# Patient Record
Sex: Female | Born: 1940 | Race: White | Hispanic: No | Smoking: Never smoker
Health system: Southern US, Community
[De-identification: ages and names within clinical notes are randomized; demographics above are authoritative.]

## PROBLEM LIST (undated history)

## (undated) DIAGNOSIS — N393 Stress incontinence (female) (male): Secondary | ICD-10-CM

## (undated) DIAGNOSIS — K219 Gastro-esophageal reflux disease without esophagitis: Secondary | ICD-10-CM

## (undated) DIAGNOSIS — K922 Gastrointestinal hemorrhage, unspecified: Secondary | ICD-10-CM

## (undated) DIAGNOSIS — N1330 Unspecified hydronephrosis: Secondary | ICD-10-CM

## (undated) DIAGNOSIS — Z862 Personal history of diseases of the blood and blood-forming organs and certain disorders involving the immune mechanism: Secondary | ICD-10-CM

## (undated) DIAGNOSIS — N6019 Diffuse cystic mastopathy of unspecified breast: Secondary | ICD-10-CM

## (undated) DIAGNOSIS — E785 Hyperlipidemia, unspecified: Secondary | ICD-10-CM

## (undated) DIAGNOSIS — J302 Other seasonal allergic rhinitis: Secondary | ICD-10-CM

## (undated) DIAGNOSIS — F419 Anxiety disorder, unspecified: Secondary | ICD-10-CM

## (undated) DIAGNOSIS — Z87828 Personal history of other (healed) physical injury and trauma: Secondary | ICD-10-CM

## (undated) DIAGNOSIS — R413 Other amnesia: Secondary | ICD-10-CM

## (undated) DIAGNOSIS — I10 Essential (primary) hypertension: Secondary | ICD-10-CM

## (undated) DIAGNOSIS — M1990 Unspecified osteoarthritis, unspecified site: Secondary | ICD-10-CM

## (undated) DIAGNOSIS — G43809 Other migraine, not intractable, without status migrainosus: Secondary | ICD-10-CM

## (undated) DIAGNOSIS — F329 Major depressive disorder, single episode, unspecified: Secondary | ICD-10-CM

## (undated) DIAGNOSIS — F32A Depression, unspecified: Secondary | ICD-10-CM

## (undated) HISTORY — DX: Hyperlipidemia, unspecified: E78.5

## (undated) HISTORY — PX: ABDOMINAL HYSTERECTOMY: SHX81

## (undated) HISTORY — DX: Essential (primary) hypertension: I10

## (undated) HISTORY — PX: BREAST SURGERY: SHX581

---
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
## 2001-05-05 ENCOUNTER — Other Ambulatory Visit: Admission: RE | Admit: 2001-05-05 | Discharge: 2001-05-05 | Payer: Self-pay | Admitting: Plastic Surgery

## 2004-10-30 ENCOUNTER — Encounter: Admission: RE | Admit: 2004-10-30 | Discharge: 2004-10-30 | Payer: Self-pay | Admitting: Internal Medicine

## 2004-12-08 ENCOUNTER — Ambulatory Visit: Admission: RE | Admit: 2004-12-08 | Discharge: 2004-12-08 | Payer: Self-pay | Admitting: Family Medicine

## 2005-01-12 ENCOUNTER — Encounter (INDEPENDENT_AMBULATORY_CARE_PROVIDER_SITE_OTHER): Payer: Self-pay | Admitting: Specialist

## 2005-01-12 ENCOUNTER — Inpatient Hospital Stay (HOSPITAL_COMMUNITY): Admission: RE | Admit: 2005-01-12 | Discharge: 2005-01-20 | Payer: Self-pay | Admitting: General Surgery

## 2005-01-30 ENCOUNTER — Encounter: Admission: RE | Admit: 2005-01-30 | Discharge: 2005-01-30 | Payer: Self-pay | Admitting: Thoracic Surgery

## 2005-02-14 ENCOUNTER — Encounter: Admission: RE | Admit: 2005-02-14 | Discharge: 2005-02-14 | Payer: Self-pay | Admitting: Thoracic Surgery

## 2005-03-07 ENCOUNTER — Encounter: Admission: RE | Admit: 2005-03-07 | Discharge: 2005-03-07 | Payer: Self-pay | Admitting: Thoracic Surgery

## 2009-01-05 ENCOUNTER — Ambulatory Visit: Payer: Self-pay | Admitting: Vascular Surgery

## 2009-05-24 DIAGNOSIS — Z8719 Personal history of other diseases of the digestive system: Secondary | ICD-10-CM

## 2009-05-24 HISTORY — DX: Personal history of other diseases of the digestive system: Z87.19

## 2009-06-20 ENCOUNTER — Inpatient Hospital Stay (HOSPITAL_COMMUNITY): Admission: EM | Admit: 2009-06-20 | Discharge: 2009-06-23 | Payer: Self-pay | Admitting: Emergency Medicine

## 2009-06-20 ENCOUNTER — Ambulatory Visit: Payer: Self-pay | Admitting: Vascular Surgery

## 2009-06-22 ENCOUNTER — Encounter (HOSPITAL_BASED_OUTPATIENT_CLINIC_OR_DEPARTMENT_OTHER): Payer: Self-pay | Admitting: Internal Medicine

## 2011-04-01 LAB — GLUCOSE, CAPILLARY: Glucose-Capillary: 90 mg/dL (ref 70–99)

## 2011-04-02 LAB — URINALYSIS, ROUTINE W REFLEX MICROSCOPIC
Bilirubin Urine: NEGATIVE
Glucose, UA: NEGATIVE mg/dL
Hgb urine dipstick: NEGATIVE
Nitrite: NEGATIVE
Specific Gravity, Urine: 1.015 (ref 1.005–1.030)
pH: 6 (ref 5.0–8.0)

## 2011-04-02 LAB — CBC
HCT: 33 % — ABNORMAL LOW (ref 36.0–46.0)
HCT: 35 % — ABNORMAL LOW (ref 36.0–46.0)
Hemoglobin: 11.2 g/dL — ABNORMAL LOW (ref 12.0–15.0)
MCHC: 33.5 g/dL (ref 30.0–36.0)
MCHC: 33.9 g/dL (ref 30.0–36.0)
MCV: 92.3 fL (ref 78.0–100.0)
MCV: 92.6 fL (ref 78.0–100.0)
MCV: 93.3 fL (ref 78.0–100.0)
Platelets: 152 10*3/uL (ref 150–400)
Platelets: 160 10*3/uL (ref 150–400)
Platelets: 161 10*3/uL (ref 150–400)
RBC: 3.58 MIL/uL — ABNORMAL LOW (ref 3.87–5.11)
RDW: 14.5 % (ref 11.5–15.5)
RDW: 14.8 % (ref 11.5–15.5)
RDW: 15.1 % (ref 11.5–15.5)
WBC: 11.9 10*3/uL — ABNORMAL HIGH (ref 4.0–10.5)

## 2011-04-02 LAB — DIFFERENTIAL
Basophils Relative: 0 % (ref 0–1)
Eosinophils Absolute: 0.1 10*3/uL (ref 0.0–0.7)
Eosinophils Relative: 1 % (ref 0–5)
Monocytes Absolute: 0.7 10*3/uL (ref 0.1–1.0)
Monocytes Relative: 6 % (ref 3–12)

## 2011-04-02 LAB — ABO/RH: ABO/RH(D): O POS

## 2011-04-02 LAB — GLUCOSE, CAPILLARY: Glucose-Capillary: 62 mg/dL — ABNORMAL LOW (ref 70–99)

## 2011-04-02 LAB — COMPREHENSIVE METABOLIC PANEL
ALT: 14 U/L (ref 0–35)
Albumin: 2.7 g/dL — ABNORMAL LOW (ref 3.5–5.2)
Alkaline Phosphatase: 45 U/L (ref 39–117)
Chloride: 111 mEq/L (ref 96–112)
Potassium: 3.4 mEq/L — ABNORMAL LOW (ref 3.5–5.1)
Sodium: 140 mEq/L (ref 135–145)
Total Protein: 4.8 g/dL — ABNORMAL LOW (ref 6.0–8.3)

## 2011-04-02 LAB — BASIC METABOLIC PANEL
BUN: 8 mg/dL (ref 6–23)
Chloride: 112 mEq/L (ref 96–112)
GFR calc non Af Amer: >60 mL/min (ref >60)
Glucose, Bld: 110 mg/dL — ABNORMAL HIGH (ref 70–99)
Potassium: 3.7 mEq/L (ref 3.5–5.1)

## 2011-04-02 LAB — CROSSMATCH: Antibody Screen: NEGATIVE

## 2011-04-02 LAB — POCT CARDIAC MARKERS: CKMB, poc: <1 ng/mL — ABNORMAL LOW (ref 1.0–8.0)

## 2011-05-08 NOTE — Consult Note (Signed)
NEW PATIENT CONSULTATION   Alison, Mcguire D  DOB:  1941-09-27                                       01/05/2009  OZHYQ#:[id number]   The patient presents today for evaluation of right leg pain and also  irregularity in the medial proximal thighs bilaterally.  She feels that  this is nodular and feels this to be venous.  She does have discomfort  in her right leg, she describes this as an aching sensation throughout  and describes it as similar to shingles type discomfort.  She does have  a history of noninsulin-dependent diabetes, has a history of elevated  cholesterol.  She denies prior cardiac difficulties.  She does have a  premature history of atherosclerotic disease in brothers.   SOCIAL HISTORY:  She is married, 3 children.  She is retired.  She does  not smoke, drink alcohol.   REVIEW OF SYSTEMS:  Positive for weight gain, shortness of breath with  exertion, pain in her legs with walking and lying flat, arthritic joint  pain.  She does have a history of prior, what sounds like to, erosive  esophagitis or Barrett's and has undergone a prior fundoplication.   ALLERGIES TO MEDICATIONS:  Codeine, Ambien, Tylox, Demerol.   MED LIST:  Is attached to her chart.   PHYSICAL EXAM:  General:  A well-developed well-nourished white female  appearing stated age of 8.  Vital Signs:  Blood pressure is 134/81,  pulse 78, respirations 18.  Her radial, femoral and posterior tibial  pulses are 2+ bilaterally.  She does not have any evidence of large  varicosities.  She has an area on the medial thigh near the perineum  bilaterally with superficial skin discoloration and perhaps some  thickening.  This does not appear to be venous and does not appear to  have any evidence of thrombophlebitis.  She does not have any  significant swelling.   She underwent noninvasive vascular laboratory studies in our office  which show scattered superficial veins but no evidence of  large  varicosities.  I imaged the area over these areas of concern and do not  see any evidence of any venous structures under this.  She had undergone  noninvasive vascular laboratory studies in Dr. Silvano Rusk office showing  normal arterial flow and this is confirmed with her physical exam.  I  had a long discussion with the patient.  I explained that she does not  appear to have any significant pathology in her arterial or venous  system.  I am unclear as to the etiology of her discomfort or the area  of concern in the upper thighs.  I have reassured her that I do not see  any serious pathology.  She will continue to follow-up with Dr.  Eloise Harman.  I suggested that perhaps dermatologic evaluation of these  areas would be appropriate if they persist.   Larina Earthly, M.D.  Electronically Signed   TFE/MEDQ  D:  01/05/2009  T:  01/06/2009  Job:  [id number]   cc:   Barry Dienes. Eloise Harman, M.D.

## 2011-05-08 NOTE — Consult Note (Signed)
NAME:  Alison Mcguire, Alison Mcguire NO.:  [id number]   MEDICAL RECORD NO.:  [id number]          PATIENT TYPE:  INP   LOCATION:  1341                         FACILITY:  [facility]   PHYSICIAN:  Angelia Mould. Derrell Lolling, M.D.DATE OF BIRTH:  [birth year]   DATE OF CONSULTATION:  06/21/2009  DATE OF DISCHARGE:                                 CONSULTATION   REASON FOR CONSULTATION:  Evaluate lower GI bleed due to cecal ulcer.   HISTORY OF PRESENT ILLNESS:  This is a 70 year old Caucasian female who  has been in stable health.  Yesterday at approximately 11 a.m. after  eating while driving a car she developed sharp, cramping abdominal pain,  stopped, went to the bathroom and had a bloody bowel movement with  clots.  She became dizzy with tingling in her arms and called 9-1-1, and  was transported to the emergency room for evaluation.  She was given IV  fluids and actually started on blood transfusion at that time.  She  denied any nausea, vomiting, constipation, diarrhea, fever or chills or  prior episodes.  She was seen by emergency department physician and  admitted by Dr. Jarome Matin.   She has stabilized and the bleeding has stopped.  Dr. Kinnie Scales came to see  her and he gave her a bowel prep and performed a colonoscopy about 1  hour ago, and the findings were of an acute cecal ulcer.  He describes  this as a large ulcer but perhaps 1-2 cm at most.  It was not actively  bleeding.  There was a visible vessel.  He was able to clip this and  there was no bleeding after the clipping.  The patient has emerged from  her sedation and feels fine at this time.   Dr. Kinnie Scales asked me to evaluate her and to stand by in case she re-  bleeds.  We have discussed this and he feels that he has done just about  all he can from a colonoscopic standpoint and if she re-bleeds, she  probably should be considered for emergent limited right colectomy.   PAST HISTORY:  Gastroesophageal reflux disease and  achalasia and large  esophageal diverticulum.  This was resected in 2006 with myotomy and  Belsey Mark IV fundoplication.  She has had atypical chest pain 2005  with a normal cardiac workup and normal Cardiolite exercise test.  Last  colonoscopy was 5 years ago showing only diverticulosis.  She has  diabetes mellitus type 2.  She has hyperlipidemia and depression.  She  has had a tonsillectomy 1954.  Vaginal hysterectomy 1970.  Bilateral  simple mastectomies 1974 because of fibrocystic change.  Right breast  implant 1982.  Redo right breast implant 2002.   MEDICATIONS:  1. Actos 30 mg a day.  2. Aspirin 81 mg a day.  3. Premarin 0.3 mg a day.  4. Ativan 0.5 mg three times a day.  5. Effexor XR 150 mg daily.  6. Vitamin D.  7. Detrol LA 4 mg daily.  8. Prilosec 20 mg or Zantac 150 mg once daily.  9. Lovastatin 20 mg a day.  10.Maxzide  25 mg tablet daily.   DRUG ALLERGIES:  CODEINE, BETADINE, CLINORIL, SOMA, DEMEROL, ETHER.  Intolerance of ANTIHISTAMINES.   SOCIAL HISTORY:  She is married.  She is a retired Engineer, civil (consulting).  She denies  alcohol or tobacco.  She has three children.  One son died at a young  age from myocardial infarction.  Another son has diabetes.  She has a  daughter in good health.   FAMILY HISTORY:  Father died age 5 MVA.  Mother died age 62 of cervical  cancer.  No family history of colon cancer or breast cancer.   REVIEW OF SYSTEMS:  A 10-system review of systems is performed and is  noncontributory except as described above.   PHYSICAL EXAM:  CONSTITUTIONAL:  This is a very pleasant, alert  Caucasian female in no distress.  Very conversant.  Very oriented.  VITAL SIGNS:  Have been stable, heart rate has been running around 88,  blood pressure around 113/65, oxygen saturations in the around 98 on  room air.  EYES:  Sclerae clear.  Extraocular intact.  EARS, MOUTH, THROAT, NOSE, TONGUE AND OROPHARYNX:  Without gross  lesions.  NECK:  Supple, nontender.  No mass.   No jugular distention.  No bruit.  LUNGS:  Clear to auscultation.  No chest wall tenderness.  HEART:  Regular regular rate and rhythm with occasional ectopy.  No  murmur.  ABDOMEN:  Soft, flat, nontender.  I do not feel any mass do not feel any  scars.  Liver and spleen are not enlarged.  EXTREMITIES:  She moves all four extremities well without pain or  deformity.  She may have trace ankle edema.  NEUROLOGIC:  No gross motor sensory deficits.  RECTAL:  Not performed since she recently completed a colonoscopy.   LAB WORK:  Hemoglobin at 3:30 a.m. was 11.9.  Hemoglobin at 2:15 p.m.  today was 11.2, glucose 110.  Electrolytes normal.  Liver function tests  normal.  PT 14.3, INR 1.1, PTT 26 all normal.  Blood type is O positive.   ASSESSMENT:  1. Lower gastrointestinal bleeding due to presumably benign cecal      ulcer.  Pathophysiology of this is unclear since she does not take      any NSAIDS and is not an alcohol user.  She does take aspirin once      a day.  2. History of gastroesophageal reflux disease in achalasia status post      myotomy and Belsey Mark IV fundoplication.  3. Diabetes mellitus type 2.  4. History of shingles, recently treated.  5. Hyperlipidemia.  6. Depression.  7. Status post vaginal hysterectomy.   PLAN:  1. The patient will be observed on clear liquid diet overnight, and      advance to full liquid diet tomorrow.  2. Hopefully the bleeding has stopped and she will be able to be      discharged in 24-48 hours with followup colonoscopy in 3 months      planned by Dr. Kinnie Scales.  3. If she re-bleeds we will be forced to offer emergent limited right      colectomy.  This could be considered to be done laparoscopically if      her vital signs are stable.  If not, then we would simply to do it      open for expeditious regions.   I explained this to the patient and her husband.  They showed full  understanding.  She is a retired Engineer, civil (consulting)  and has good medical  insight.      Angelia Mould. Derrell Lolling, M.D.  Electronically Signed     HMI/MEDQ  D:  06/21/2009  T:  06/21/2009  Job:  [id number]   cc:   Griffith Citron, M.D.  Fax: [phone]   Barry Dienes. Eloise Harman, M.D.  Fax: [phone]

## 2011-05-08 NOTE — Op Note (Signed)
NAME:  DANY, HARTEN NO.:  [id number]   MEDICAL RECORD NO.:  [id number]          PATIENT TYPE:  INP   LOCATION:  1341                         FACILITY:  [facility]   PHYSICIAN:  Griffith Citron, M.D.DATE OF BIRTH:  20-Apr-1941   DATE OF PROCEDURE:  06/21/2009  DATE OF DISCHARGE:                               OPERATIVE REPORT   Colonoscopy with endoclip.   INDICATIONS:  A 70 year old white female with a lower GI hemorrhage.   PREPARATION:  One-half gallon of HalfLytely.   SEDATION:  Versed 10 mg, fentanyl 100 mcg IV.   PROCEDURE:  After reviewing the nature of the procedure with the  patient, including but not limited to the potential risks of hemorrhage  and perforation, informed consent was signed.  The patient was brought  to the endoscopy suite, where she was premedicated with divided doses of  Versed and fentanyl.  Using a Pentax video colonoscope, the rectum was  intubated after digital examination revealed no evidence of perianal or  intrarectal pathology.  The colonoscope was inserted and advanced  through the entire length of the colon to the cecum, which was  identified by the appendiceal orifice and ileocecal valve.  These were  photographed.  The preparation was excellent throughout.  There were  flecks of bright red blood in the otherwise clear effluent.   At the base of the ileocecal valve in the cecum, a 1 cm shaggy bordered  ulcer was identified with visible vessel.  There was no bleeding.  There  was no evidence of neoplastic infiltration or ischemia.  The cecum was  rather spastic and difficult to fully insufflate.  I was able to place 2  hemoclips across the base of the ulcer, and hemostasis was maintained  throughout.   The scope was then slowly withdrawn with careful inspection of the  entire colon in a retrograde manner.  There were scattered diverticula  in both the right and left colon with no evidence of clot in the  diverticula and no  evidence of inflammation.  No neoplasia was  identified.  The mucosa was intact, and there was no evidence of  ischemic change, neoplasia, AVM, or other abnormality to explain  bleeding.  Retroflexed view in the rectal vault was normal.  Hemorrhoids  were not appreciated.   The colon was decompressed, the scope withdrawn.  Patient tolerated the  procedure without difficulty, being maintained on low-flow oxygen, pulse  oximetry throughout.  Returned to recovery in stable condition.   PROCEDURE TIME:  Forty minutes.   Time:  3.  Technical:  3  Preparation:  1.  Total equal to 7.   ASSESSMENT:  1. Cecal ulcer with visible vessel, probable source of lower      gastrointestinal hemorrhage, etiology uncertain.  2. Hemoclips placed across the cecal ulcer with hemostasis maintained      throughout.  3. Diverticulosis, rare, right and left colon, nonbleeding.   RECOMMENDATIONS:  1. Maintain on clear liquids overnight.  Advance diet in the morning      if stable.  2. I discussed today's procedure with Dr. Timothy Lasso covering for Dr.  Jarome Matin.  I also discussed the case with Dr. Claud Kelp, who will see the patient in consultation for surgery.  I do      not think surgery will be needed unless bleeding occurs, in which      case a right hemicolectomy is recommended.  3. Repeat colonoscopy in 2 to 3 months to reassess the ulcer bed and      evaluate possible underlying pathology.      Griffith Citron, M.D.  Electronically Signed     JRM/MEDQ  D:  06/21/2009  T:  06/21/2009  Job:  [id number]   cc:   Angelia Mould. Derrell Lolling, M.D.  [address]  [place]  [place] [address]   Barry Dienes. Eloise Harman, M.D.  Fax: [phone]

## 2011-05-08 NOTE — Consult Note (Signed)
NAME:  Alison Mcguire, Alison Mcguire NO.:  [id number]   MEDICAL RECORD NO.:  [id number]          PATIENT TYPE:  EMS   LOCATION:  ED                           FACILITY:  [facility]   PHYSICIAN:  Griffith Citron, M.D.DATE OF BIRTH:  [birth year]   DATE OF CONSULTATION:  06/20/2009  DATE OF DISCHARGE:  06/20/2009                                 CONSULTATION   REFERRING PHYSICIAN:  Dr. Jarome Matin   REASON FOR CONSULTATION:  A 70 year old white female patient of Dr.  Jarome Matin who asked me to assist with evaluation and treatment of  patient who presents with acute GI bleeding.   HISTORY OF PRESENT ILLNESS:  The patient was in her usual state of good  health this morning.  While driving at 16:10 a.m., she noted the acute  onset of sharp stabbing abdominal pain.  This generalized and became  cramping in nature.  She noted associated abdominal distention.  This  was rapidly followed by the need to have an urgent bowel movement at  approximately 11:00 a.m.  The patient then began to pass large volume of  maroon-colored blood and large clots per rectum.  This went on for  approximately 20 minutes until she became presyncopal.  The patient then  called EMS by cell phone who arrived within a matter of minutes.  The  patient was brought to [facility] Emergency Room where she was noted to  have an initial blood pressure of 90/40 at approximately 1:30 p.m.; her  pulse was 100.  She did not become completely syncopal.  No head trauma.  Since admission to [facility] Emergency Room, the patient has had no  further bleeding.  Vital signs have been stable.  She does complain of  mild generalized abdominal soreness, somewhat greater in the lower  quadrants, right equal to left.   There has been no nausea or vomiting.  The patient denies dyspepsia, no  pyrosis or regurgitation.  She denies hematemesis.   The patient has minimal risk factors.  She denies NSAIDs.  She does use  cardioprotective doses of aspirin.  She is receiving her first unit of  PRBC.   The patient has a past GI history of achalasia with large esophageal  diverticulum.  She underwent resection by Dr. Dewayne Shorter in 2006 of the  esophageal diverticulum, myotomy, and fundoplication which were  successfully completed.  Despite this, she continues to have symptoms of  dysphagia.  I initially evaluated her in 2006 and performed endoscopy  with Savary esophageal dilation.  This was of minimal benefit.  She  denies impaction and again without vomiting, retching or hematemesis.  She is maintained on a generic PPI.   The patient underwent colonoscopy in 2005, at which time she had rare  diverticulum; no neoplasia was identified.  Otherwise, she has no  significant lower GI history.   PAST MEDICAL HISTORY:  1. Diabetes.  2. Hyperlipidemia.  3. Hypertension.  4. Depression.   SOCIAL HISTORY:  Patient is a Press photographer.  She does extensive  volunteer work, nondrinker, nonsmoker, no drug use.   CURRENT MEDICAL REGIMEN:  1.  Lorazepam 1 mg t.i.d.  2. Lovastatin 20 mg daily.  3. Estrogen/methyltestosterone 1 daily.  4. Vitamin D 50,000 units weekly.  5. Effexor XR 150 mg every evening.  6. Actos 30 mg daily.  7. ? - PPI.  (The patient claims that she is taking a generic PPI, but      this is not on her current drug list.   PHYSICAL EXAMINATION:  GENERAL:  The patient is lying in bed, alert,  acute, oriented, in no acute distress receiving first unit of PRBC.  VITAL SIGNS:  BP 112/70, pulse 100 increasing to 114 with sitting.  HEENT:  Atraumatic.  EOMI.  PERRL.  Mild conjunctival pallor.  Mouth:  No oropharyngeal lesion.  Dentition in poor repair.  NECK:  Supple, no adenopathy or thyromegaly.  CHEST:  Clear to auscultation.  CARDIAC:  Regular rhythm, no gallop, no murmur.  ABDOMEN:  Quiet, nondistended, mild diffuse tenderness greatest in the  lower quadrants, right equal to left.  No  rebound, no guarding.  No  organomegaly.  Obese, striae.  RECTAL:  Not repeated with maroon stool documented in the ER.  EXTREMITIES:  No clubbing, cyanosis or edema.   LABORATORY:  WBC 11.9, hemoglobin 11.2, hematocrit 33, platelet 161,000.  CMP notable for BUN of 15, creatinine 1.05, total protein 4.8, albumin  27, pro time and PTT normal.   ASSESSMENT:  1. Lower gastrointestinal hemorrhage:  The patient's history, finding      of maroon stool with passage of clots, lower abdominal pain, acute      onset, absent dyspepsia, lack of risk factors and normal BUN to      creatinine ratio all strongly point to a larger gastrointestinal      source of bleeding.  Most likely, this is a diverticular bleed or      perhaps ischemic bowel.  The tenderness certainly suggests ischemic      bowel, and the patient has been working out in the yard      considerably and is also on hormone replacement therapy, all of      which would favor an ischemic picture.  Patient has not had any      further bleeding since admission 7 hours ago.  She is being      rehydrated and receiving first unit of packed red blood cells.  2. Anemia secondary to acute gastrointestinal blood loss.  3. History of esophageal surgery--diverticulum resection, myotomy for      achalasia, fundoplication.   RECOMMENDATIONS:  1. Supportive:  IV fluids transfused p.r.n.  2. Prophylactic PPI coverage.  3. Clear liquids.  4. Abdominal flat plate.  5. Bed and bowel rest.  6. Anticipate colonoscopy when stable, most likely June 22, 2009.      Griffith Citron, M.D.  Electronically Signed     JRM/MEDQ  D:  06/20/2009  T:  06/20/2009  Job:  [id number]   cc:   Barry Dienes. Eloise Harman, M.D.  Fax: [phone]

## 2011-05-08 NOTE — H&P (Signed)
NAME:  Alison Mcguire, Alison Mcguire NO.:  [id number]   MEDICAL RECORD NO.:  [id number]          PATIENT TYPE:  EMS   LOCATION:  ED                           FACILITY:  [facility]   PHYSICIAN:  Barry Dienes. Eloise Harman, M.D.DATE OF BIRTH:  [birth year]   DATE OF ADMISSION:  06/20/2009  DATE OF DISCHARGE:  06/20/2009                              HISTORY & PHYSICAL   CHIEF COMPLAINT:  Rectal bleeding.   HISTORY OF PRESENT ILLNESS:  The patient is a 70 year old white woman  who is well known to me.  She was in her usual state of good health this  morning.  After having a banana sandwich this a.m., at approximately  11:00 a.m. while driving, she developed sharp cramping abdominal pain,  so she stopped at a gas station to go to the bathroom.  In the bathroom,  she had a large amount of bright red blood pass per rectum with clots  that lasted several minutes.  She also became very dizzy with tingling  in her arms, so she called 911.  Emergency medical personnel noted a  large amount of bright red blood in the toilet bowl and on her clothing  and transported her to the emergency room for evaluation.  In the  ambulance, she was given 1 L of IV fluids.  In the emergency room, she  has also been given IV fluids and started on transfusion of 2 units of  packed red blood cells.  She denies having symptoms of nausea, vomiting,  constipation or diarrhea, or fever or chills.  Currently, she feels at  her baseline and is not having cramping abdominal pain.   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  She  has also had atypical chest pain with a 2005 Cardiolite exercise test  normal.  Her last colonoscopy was in 2005 showing only diverticulosis  with no neoplasms.  She also has a history of diabetes mellitus, type 2,  with most recent hemoglobin  A1c level in May 2010 at 5.9%.  She has  hyperlipidemia, depression, and mild forgetfulness.   She was recently treated for shingles with Valtrex 1000 mg b.i.d. for 5  days that ended on June 11, 2009, and with prednisone burst treatment  that ended on June 13, 2009.   Other medications prior to admission:  1. Actos 30 mg daily.  2. Aspirin 81 mg daily.  3. Premarin 0.3 mg p.o. daily.  4. Ativan 0.5 mg p.o. t.i.d.  5. Effexor XR 150 mg p.o. daily.  6. Vitamin D 50,000 units p.o. once weekly.  7. Detrol LA 4 mg p.o. daily.  8. Prilosec 20 mg or Zantac 150 mg once daily.  9. Lovastatin 20 mg daily.  10.Maxzide 25 one tab p.o. daily.   PAST SURGICAL HISTORY:  In 1954, tonsillectomy.  In 1970, vaginal  hysterectomy.  In 1974, bilateral simple mastectomies because of  fibrocystic change.  In 1982, right breast implant.  In 2002, redo of  right breast  implant.  In January 2006, esophageal diverticulum  resection with esophageal myotomy and Belsey Mark IV fundoplication.  In  March 2006, EGD with Savary dilatation.   ALLERGIES:  CODEINE, BETADINE, CLINORIL, SOMA, DEMEROL, AND ETHER.  She  has intolerance of some ANTIHISTAMINES.   SOCIAL HISTORY:  She is married.  She is a retired [employer] fund[employer].  She has no  history of tobacco or alcohol abuse.  She had 3 children.  One son died  at a young age from a myocardial infarction.  Another son has diabetes  mellitus, type 2, and a daughter is in good health and has 2 children.   FAMILY HISTORY:  Father died at age 5 of motor vehicle accident.  The  mother died at age 41 of cervical cancer.  There is no family history of  colon cancer or breast cancer.   REVIEW OF SYSTEMS:  She denies recent fever, chills, shortness of  breath, cough, chest pain or palpitations, nausea, vomiting,  constipation or dysuria.   INITIAL PHYSICAL EXAMINATION:  VITAL SIGNS:  Initial blood pressure was  92/46 and currently is 113/60 with pulse 94, respirations 20 and   temperature 98.3 with pulse oxygen saturation 99% on nasal cannula  oxygen.  IN GENERAL:  She is a well-nourished, well-developed, white female who  is in no apparent distress while lying semi-supine.  HEENT:  Within normal limits.  NECK:  Supple without jugular venous distention or carotid bruit.  CHEST:  Clear to auscultation.  HEART:  Irregular rate and rhythm without significant murmur or gallop.  ABDOMEN:  Normal bowel sounds and no hepatosplenomegaly or tenderness.  EXTREMITIES:  Bilateral trace leg edema.  NEUROLOGICAL:  She was alert and well ordered with a normal affect with  no focal neurologic deficits.  RECTAL:  Showed maroon-colored stool with no melena.   INITIAL LABORATORY STUDIES:  White blood cell count 11.9, hemoglobin  11.2, hematocrit 33, platelets 161.  Serum sodium 140, potassium 3.4,  chloride 111, carbon dioxide 27, BUN 15, creatinine 0.8, glucose 162.  Urinalysis was nitrite negative.   IMPRESSION/PLAN:  1. Rectal bleeding:  This most likely is secondary to a diverticular      bleed given her lack of melena or elevation in BUN level as well as      initial hypotension.  Ischemic colitis, neoplasm or an upper      gastrointestinal source of blood loss seems less likely.  We will      treat her with empiric Protonix IV and complete the transfusion of      2 units of packed red blood cells.  A colonoscopy with possible      endoscopy is currently planned for June 22, 2009, or sooner if      symptoms increase.  We may do a nuclear medicine tagged red blood      cell study if bleeding persists.  Aspirin, of course, will be held.  2. Diabetes mellitus, type 2:  Stable on Actos.  Per her request, we      will monitor her blood glucose levels once daily and give sliding      scale insulin only if the levels are significantly elevated.  3. Hypokalemia:  Mild and most likely due to the administration of      moderate volume normal saline.  Her IV fluids will be changed  to      normal saline with 20 mEq of potassium per L, and we will recheck  her BMET blood test in the a.m.           ______________________________  Barry Dienes. Eloise Harman, M.D.     DGP/MEDQ  D:  06/20/2009  T:  06/20/2009  Job:  [id number]   cc:   Ines Bloomer, M.D.  [address]  [place]  [place] [address]   Lorne Skeens. Hoxworth, M.D.  [address]  [place]  [place] [address]   Cassell Clement, M.D.  Fax: [phone]

## 2011-05-08 NOTE — Procedures (Signed)
LOWER EXTREMITY VENOUS REFLUX EXAM   INDICATION:  Bilateral varicose vein with pain.   EXAM:  Using color-flow imaging and pulse Doppler spectral analysis, the  right and left common femoral, superficial femoral, popliteal, posterior  tibial, greater and lesser saphenous veins are evaluated.  There is  evidence suggesting deep venous insufficiency in the right and left  lower extremities.   The right saphenofemoral junction is not competent.  The left is  competent.  The right GSV is not competent with the caliber as described  below.  The left is competent.   The right and left proximal short saphenous vein demonstrates  competency.   GSV Diameter (used if found to be incompetent only)                                            Right    Left  Proximal Greater Saphenous Vein           0.57 cm  cm  Proximal-to-mid-thigh                     0.31 cm  cm  Mid thigh                                 cm       cm  Mid-distal thigh                          cm       cm  Distal thigh                              0.33 cm  cm  Knee                                      0.34 cm  cm   IMPRESSION:  1. Right greater saphenous vein reflux is identified with the caliber      ranging from 0.31 cm to 0.57 cm knee to groin, the left greater      saphenous vein shows no evidence of reflux.  2. The right and left greater saphenous veins are not aneurysmal.  3. The right and left greater saphenous veins are not tortuous.  4. The deep venous system is not competent.  5. The right and left lesser saphenous veins are competent.   ___________________________________________  Larina Earthly, M.D.   AS/MEDQ  D:  01/05/2009  T:  01/05/2009  Job:  [id number]

## 2011-05-11 NOTE — Discharge Summary (Signed)
NAME:  Alison Mcguire, Alison Mcguire NO.:  [id number]   MEDICAL RECORD NO.:  [id number]          PATIENT TYPE:  INP   LOCATION:  1341                         FACILITY:  [facility]   PHYSICIAN:  Barry Dienes. Eloise Harman, M.D.DATE OF BIRTH:  [birth year]   DATE OF ADMISSION:  06/20/2009  DATE OF DISCHARGE:  06/23/2009                               DISCHARGE SUMMARY   PERTINENT FINDINGS:  The patient is a 70 year old white woman who is  well known to me.  She was in her usual state of good health until the  morning of admission.  After having a banana sandwich at approximately  11 a.m., while driving, she developed sharp, crampy abdominal pain.  She  stopped at a gas station to go to the bathroom.  In the bathroom, she  had a large amount of bright red blood passed per rectum with clots that  lasted several minutes.  She became very dizzy and had tingling in her  arms, so she called 911 from the bathroom.  Emergency medical personnel  noted a large amount of bright red blood in the toilet bowl and on her  clothing and she was transported to the emergency room for evaluation.  In the ambulance, she was given 1 liter of IV fluids.  In the emergency  room, she was given additional moderate amount of IV fluids and  transfused with 2 units packed red blood cells.  She denied having  preceding symptoms of nausea, vomiting, constipation, diarrhea, fever,  or chills.  At the time of my evaluation, she is no longer having crampy  abdominal pain.   PAST MEDICAL HISTORY:  Gastroesophageal reflux disease with achalasia  associated with large esophageal diverticulum that was resected in 2006  with a Harvest Dark IV fundoplication procedure.  She also has had  atypical chest pain with 2005 Cardiolite exercise test normal.  Her last  colonoscopy in 2005 showed diverticulosis with no polyps.  She has a  history of diabetes mellitus type 2 with May 2010 hemoglobin A1c level  of 5.96%, and also has a history of  hyperlipidemia, depression, and mild  forgetfulness.   MEDICATIONS PRIOR TO ADMISSION:  1. Actos 30 mg daily.  2. Aspirin 81 mg daily.  3. Premarin 0.3 mg p.o. daily.  4. Ativan 0.5 mg p.o. t.i.d.  5. Effexor XR 150 mg daily.  6. Vitamin D 50,000 units p.o. once weekly.  7. Detrol LA 4 mg daily.  8. Prilosec 20 mg or Zantac 150 mg daily.  9. Lovastatin 20 mg daily.  10.Maxzide 25 one tablet p.o. daily.   See admission history of physical for details of her past surgical  history, allergies and medication intolerances, social history, family  history, and review of systems.   INITIAL PHYSICAL EXAM:  INITIAL VITAL SIGNS:  Blood pressure 92/46,  pulse 94, respirations 20, temperature 98.3, pulse oxygen saturation 99%  on nasal cannula oxygen.  GENERAL:  She is a well-nourished, well-developed white female who is in  no apparent distress while lying supine.  HEAD, EYES, EARS, NOSE AND THROAT:  Within normal limits.  NECK:  Supple without jugular  venous distention or carotid bruit.  CHEST:  Clear to auscultation.  HEART:  Had a slightly irregular rhythm without significant murmur or  gallop.  ABDOMEN:  Had normal bowel sounds and no hepatosplenomegaly or  tenderness.  EXTREMITIES:  Had bilateral trace leg edema.  NEUROLOGICAL:  She is alert and well oriented with a normal affect and  no focal neurologic deficits.  RECTAL:  Showed maroon-colored stool with no melena.   INITIAL LABORATORY STUDIES:  White blood cell count 11.9, hemoglobin  11.2, hematocrit 33, platelets was 161,000.  Serum sodium 140, potassium  3.4, chloride 111, carbon dioxide 27, BUN 15, creatinine 0.8, glucose  162.  Urinalysis with nitrite negative.   HOSPITAL COURSE:  The patient was started on empiric Protonix IV for  possible blood loss, likely upper GI bleed.  A GI consultant was asked  to evaluate her and performed a colonoscopy on June 29 that showed a  large cecal ulcer with a visible vessel.  This  vessel was not actively  bleeding but was clipped to prevent re-bleed.  She also had some right  leg edema and had a right lower extremity DVT ultrasound done that  showed no evidence of DVT.  A general surgeon Claud Kelp) was asked  to evaluate her should she need urgent segmental resection.  Fortunately, she remained stable throughout the rest of her  hospitalization.   COMPLICATIONS:  None.   CONDITION ON DISCHARGE:  She felt fine.  She was eating well and no  longer had any abdominal pain.  Her stools had normalized and she had no  shortness of breath or chest discomfort.  Most recent vital signs  include blood pressure 115/70, pulse 63, respirations 20, temperature  97.9.  Her chest was clear to auscultation.  Her heart had a regular  rate and rhythm without significant murmur.  Her abdomen had normal  bowel sounds and no tenderness and she had trace right lower extremity  edema.  Most recent laboratory studies included white blood cell count  5.8, hemoglobin 11, hematocrit 33.9, platelets one 160,000.  Serum  sodium 142, potassium 3.7, chloride 112, carbon dioxide 27, BUN 8,  creatinine 0.69, glucose 110.   DISCHARGE DIAGNOSES:  1. Lower gastrointestinal bleed from a large cecal ulcer.  2. Anemia.  3. Diverticulosis.  4. Hypokalemia.  5. Gastroesophageal reflux disease.  6. Diabetes mellitus, type 2, controlled.  7. Hyperlipidemia.  8. Depression.  9. Herpes zoster  10.Asthma.  11.History of vitamin D deficiency.  12.Overactive bladder.  13.Chronic venous insufficiency.  14.Anxiety.  15.Hot flashes.   DISCHARGE MEDICATIONS:  1. Actos 30 mg daily.  2. Premarin 0.3 mg daily with recommendations to try to slowly taper      off this medicine.  3. Ativan 0.5 mg t.i.d.  4. Effexor XR 150 mg daily.  5. Vitamin D 50,000 units p.o. once weekly.  6. Detrol LA 4 mg daily.  7. Prilosec 20 mg or Zantac 150 mg daily.  8. Lovastatin 20 mg daily.  9. Maxzide 25 one tablet  p.o. every a.m. p.r.n. leg edema.   She was advised to stop taking aspirin or any nonsteroidal anti-  inflammatory drug.   DISPOSITION AND FOLLOWUP:  She was discharged to home with her husband.  She was advised to schedule a followup exam with Dr. Sharrell Ku in  approximately 2 to 3 weeks following discharge.  She was also advised to  schedule a followup appointment with Dr. Jarome Matin as concurrent  with  her upcoming annual physical exam.           ______________________________  Barry Dienes. Eloise Harman, M.D.     DGP/MEDQ  D:  07/19/2009  T:  07/20/2009  Job:  [id number]   cc:   Griffith Citron, M.D.  Fax: [phone]   Angelia Mould. Derrell Lolling, M.D.  [address], [address]  [place]  [place] [address]

## 2011-05-11 NOTE — Op Note (Signed)
NAME:  Alison Mcguire, Alison Mcguire NO.:  [id number]   MEDICAL RECORD NO.:  [id number]          PATIENT TYPE:  INP   LOCATION:  2550                         FACILITY:  [facility]   PHYSICIAN:  Ines Bloomer, M.D. DATE OF BIRTH:  [birth year]   DATE OF PROCEDURE:  01/12/2005  DATE OF DISCHARGE:                                 OPERATIVE REPORT   PREOPERATIVE DIAGNOSIS:  Esophageal diverticula, achalasia.   POSTOPERATIVE DIAGNOSIS:  Esophageal diverticula, achalasia.   OPERATION PERFORMED:  Left exploratory thoracotomy, resection of esophageal  diverticulum, esophageal myotomy, and Belsey Mark IV fundoplication.   SURGEON:  Ines Bloomer, M.D.   FIRST ASSISTANT:  Sharlet Salina T. Hoxworth, M.D.   SECOND ASSISTANT:  Claudette T. Krell, N.P.   ANESTHESIA:  General.   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  After explaining the risks of the procedure, he is  brought to the operating room for correction of the esophageal diverticulum  and the achalasia.   DESCRIPTION OF PROCEDURE:  After general anesthesia, a dual lumen tube was  inserted, he was turned to the left lateral thoracotomy position and was  prepped and draped in the usual sterile fashion.  A posterolateral  thoracotomy was made over the sixth intercostal space, the latissimus was  divided and the serratus was reflected anteriorly.  The sixth intercostal  space was entered.  Two Tuffiers' were placed at right angles.  The inferior  pulmonary ligament was taken down and then the esophagus was dissected out  superiorly and inferiorly to the large epiphrenic esophageal diverticulum.  A Penrose drain was looped superiorly and inferiorly and right above the GE  junction, another one was looped.  With gentle traction, this allowed [place] to  dissect up the 6-8 cm esophageal diverticulum and  was dissected from the  subcutaneous spaces and actually dissect the right pleura off dissected  between the aorta going posteriorly.  After it had been completely dissected  up, the esophagus was rotated bringing the esophageal diverticulum  superiorly.  We dissected the esophageal diverticulum down to its neck and  then passed a 40 lighted bougie and stapled it with a TL-60 stapler and  resected the esophageal diverticulum.  The esophageal diverticulum muscle  staple line was then inverted with interrupted 3-0 Vicryl sutures.  After  this had been done, the esophagus was allowed to go back to its normal  position.  The attention was turned to the hiatus and the esophageal phrenic  ligament were divided with electrocautery to enter the lesser greater sac  and we were able to dissect up the fundus of the stomach for approximately 3-  5 cm.  Then, the left and right crura of the diaphragm were identified and  two sutures were placed through the crura for later approximation.  Now that  the stomach was partially freed up, I decided to do the myotomy and on the  anterolateral surface, the myotomy was started.  Care was taken to protect  the vagus nerves.  The myotomy was divided both longitudinal and circular  muscles starting just above the GE junction and carrying up all the way up  past the inferior pulmonary ligament to the under surface of the aortic  arch, doing a very long myotomy.  This was then brought down onto the  stomach for approximately 2 cm dividing the gastroesophageal sphincter.  After this had been done, it was checked for leaks and none was found.  Then, the stomach was freed up and sutures placed from approximately 3 cm  down on the stomach fundus and then brought up to each side of the myotomy  approximately 2-3 cm from the inferior portion of the myotomy.  These were  replaced with 0 Surgilene and tied down.  This was the first stage of the  Belsey fundoplication.  It  was a 240 to 270 degree plication.  A second  suture was placed from the esophageal muscle to 1 cm or so below the fundus  about 1 cm below the first stitch.  Two stitches were placed, one on each  side of the myotomy, and then these stitches were placed through the rim of  the diaphragm.  The stomach was then pushed down into the abdomen and the  sutures tied down to hold it in place.  One of the two posterior crural  sutures were then tied down and care was made to be able to make sure that  it was not too tight to cause strangulation of the stomach and admitted at  least 1 to 1 1/2 fingers posteriorly.  The area was copiously irrigated and  Tisseel was applied to the myotomy as well as to the diverticulum resection.  Pericostal nerve block was done in the usual fashion with Marcaine.  The On-  Q catheters were inserted in the paravertebral space in the usual fashion  and sutured in place with 3-0 chromic and brought out medially and sutured  into place laterally with 2-0 silk.  Two chest tubes were brought through  separate stab wounds and tied in place with 0 silk, a right angle chest tube  medially and a posterior chest tube laterally.  The chest was closed with  four pericostals, #1 Vicryl in the muscle layer, 2-0 Vicryl in the  subcutaneous tissue, and 3-0 Vicryl as a subcuticular stitch.  The patient  was then returned to the recovery room in stable condition.       DPB/MEDQ  D:  01/12/2005  T:  01/12/2005  Job:  [id number]   cc:   Lorne Skeens. Hoxworth, M.D.  [address]  [place]  [place] [address]  Fax: [phone]

## 2011-05-11 NOTE — Discharge Summary (Signed)
NAME:  Alison Mcguire, Alison Mcguire NO.:  [id number]   MEDICAL RECORD NO.:  [id number]          PATIENT TYPE:  INP   LOCATION:  3305                         FACILITY:  [facility]   PHYSICIAN:  Ines Bloomer, M.D. DATE OF BIRTH:  February 10, 1941   DATE OF ADMISSION:  01/12/2005  DATE OF DISCHARGE:  01/18/2005                                 DISCHARGE SUMMARY   ADMISSION DIAGNOSIS:  Dysphagia and regurgitation.   DISCHARGE DIAGNOSIS:  1.  Diabetes.  2.  Hypercholesterolemia.  3.  Intermittent asthma.  4.  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  5.  Status post bilateral subcutaneous mastectomy in 1970 with revision of      prosthesis in 2002.  6.  Tonsillectomy and adenoidectomy and hysterectomy in the 1970s.   ALLERGIES:  Codeine.   BRIEF HISTORY:  The patient is a 70 year old female who had marked symptoms  of dysphagia with regurgitation when she leaned over and regurgitation at  night.  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  She  was thought to have symptoms of achalasia and was evaluated by Dr. Glenna Fellows.  It was his suggestion that this be repaired surgically, however,  because of the size of the diverticulum, it was thought that the best  approach would be from a left thoracotomy incision and, therefore, the  patient was referred to Dr. Edwyna Shell.  Dr. Edwyna Shell evaluated the patient on  December 06, 2004, and it was  his opinion that the patient should undergo  left thoracotomy with resection of the diverticulum and esophageal myotomy.   HOSPITAL COURSE:  The patient was admitted and taken to the OR on January 12, 2005, for a left exploratory thoracotomy, resection of esophageal  diverticulum,  esophageal myotomy, and Belsey Mark IV fundoplication.  The  patient tolerated the procedure well and was hemodynamically stable  immediately postoperatively.  The patient was transferred from the OR to the  post anesthesia care unit in stable condition.  The patient was extubated  without complications and woke up from anesthesia neurologically intact.  On  postoperative day one, the patient's chest x-ray revealed a right lower lobe  atelectasis.  She was, otherwise, stable and was kept n.p.o.  Her activity  was increased.  On postoperative day two, the patient was again in stable  condition.  Chest x-ray revealed a possible small effusion and, therefore,  the suction was increased on her chest tube system.  Her labs were stable.  Vital signs were stable.  A Gastrografin swallow was performed on  postoperative day three and this was done without complications.  There was  no evidence of obstruction or leak noted.  The remainder of the patient's  postoperative course progressed as expected.  She did have an episode of  diarrhea that lasted for several days postoperatively.  She was treated with  Flagyl  prophylactically for C. difficile.  Her C. diff was tested and it was  negative x 2.  She remained on Flagyl for several days and this was then  discontinued.  The diarrhea has resolved.  Her chest tube and invasive lines  were discontinued in a routine manner without complications.   The patient has continued to progress in a satisfactory manner and is doing  well.  On postoperative day six, she is afebrile with stable vital signs and  maintaining a normal sinus rhythm.  Her chest x-ray is stable.  On physical  exam, cardiac has a regular rate and rhythm, the lungs reveal bibasilar  crackles, left greater than right.  The abdomen is soft, nontender,  nondistended, there are hypoactive bowel sounds.  The extremities are warm  and there is no edema present.  The incision is clean, dry, and  intact.  The  patient is doing quite well at this time and if she remains in stable  condition, she will be ready for discharge in the next 1-2 days pending  morning round re-evaluation.   Labs revealed CBC on January 17, 2005, white count 5.4, hemoglobin 11.1,  hematocrit 32.3, platelet count 327.  BNP on January 17, 2005, showed sodium  141, potassium 3.6, BUN 8, creatinine 0.7, glucose 113.   CONDITION ON DISCHARGE:  Improved.   DISCHARGE MEDICATIONS:  1.  Advair 1 puff b.i.d.  2.  Lipitor 20 mg daily.  3.  Multi-vitamin daily.  4.  Actos 30 mg daily.  5.  Effexor 150 mg daily.  6.  Ativan 1 mg t.i.d.  7.  Tricor 145 mg daily.  8.  Estratest q.h.s.  9.  Protonix 40 mg daily.  10. Darvocet N100 1-2 p.o. q.4-6h. p.r.n. pain   DISCHARGE INSTRUCTIONS:  Activities:  No driving and the patient is to  continue daily breathing and walking exercises.  Diet:  Low salt, low fat  with diabetic restrictions.  Wound care:  The patient is instructed to clean  the incision daily with soap and water.  If wound problems arise, the  patient will be instructed to contact the [facility] office.  Follow up with  [facility] January 30, 2005, at 2 p.m. for a chest x-ray.  Dr. Edwyna Shell January 30, 2005, at 3 p.m.  Dr. Johna Sheriff, the patient should  contact his office for a follow up appointment after discharge.      AY/MEDQ  D:  01/18/2005  T:  01/18/2005  Job:  [id number]   cc:   Lorne Skeens. Hoxworth, M.D.  [address], [address]  [place]  [place] [address]

## 2012-07-28 ENCOUNTER — Encounter (HOSPITAL_COMMUNITY): Payer: Self-pay | Admitting: Emergency Medicine

## 2012-07-28 ENCOUNTER — Emergency Department (HOSPITAL_COMMUNITY): Payer: Medicare HMO

## 2012-07-28 ENCOUNTER — Inpatient Hospital Stay (HOSPITAL_COMMUNITY)
Admission: EM | Admit: 2012-07-28 | Discharge: 2012-08-02 | DRG: 394 | Disposition: A | Discharge status: Home / Self Care | Payer: Medicare HMO | Attending: Internal Medicine | Admitting: Internal Medicine

## 2012-07-28 DIAGNOSIS — N390 Urinary tract infection, site not specified: Secondary | ICD-10-CM | POA: Diagnosis present

## 2012-07-28 DIAGNOSIS — F411 Generalized anxiety disorder: Secondary | ICD-10-CM | POA: Diagnosis present

## 2012-07-28 DIAGNOSIS — K625 Hemorrhage of anus and rectum: Secondary | ICD-10-CM

## 2012-07-28 DIAGNOSIS — A498 Other bacterial infections of unspecified site: Secondary | ICD-10-CM | POA: Diagnosis present

## 2012-07-28 DIAGNOSIS — Z79899 Other long term (current) drug therapy: Secondary | ICD-10-CM

## 2012-07-28 DIAGNOSIS — R109 Unspecified abdominal pain: Secondary | ICD-10-CM | POA: Diagnosis present

## 2012-07-28 DIAGNOSIS — D72829 Elevated white blood cell count, unspecified: Secondary | ICD-10-CM | POA: Diagnosis present

## 2012-07-28 DIAGNOSIS — E785 Hyperlipidemia, unspecified: Secondary | ICD-10-CM | POA: Diagnosis present

## 2012-07-28 DIAGNOSIS — F329 Major depressive disorder, single episode, unspecified: Secondary | ICD-10-CM | POA: Diagnosis present

## 2012-07-28 DIAGNOSIS — F3289 Other specified depressive episodes: Secondary | ICD-10-CM | POA: Diagnosis present

## 2012-07-28 DIAGNOSIS — R197 Diarrhea, unspecified: Secondary | ICD-10-CM | POA: Diagnosis present

## 2012-07-28 DIAGNOSIS — E860 Dehydration: Secondary | ICD-10-CM | POA: Diagnosis present

## 2012-07-28 DIAGNOSIS — K559 Vascular disorder of intestine, unspecified: Principal | ICD-10-CM | POA: Diagnosis present

## 2012-07-28 DIAGNOSIS — K529 Noninfective gastroenteritis and colitis, unspecified: Secondary | ICD-10-CM

## 2012-07-28 DIAGNOSIS — K219 Gastro-esophageal reflux disease without esophagitis: Secondary | ICD-10-CM | POA: Diagnosis present

## 2012-07-28 HISTORY — DX: Anxiety disorder, unspecified: F41.9

## 2012-07-28 HISTORY — DX: Gastrointestinal hemorrhage, unspecified: K92.2

## 2012-07-28 HISTORY — DX: Major depressive disorder, single episode, unspecified: F32.9

## 2012-07-28 HISTORY — DX: Gastro-esophageal reflux disease without esophagitis: K21.9

## 2012-07-28 HISTORY — DX: Depression, unspecified: F32.A

## 2012-07-28 LAB — COMPREHENSIVE METABOLIC PANEL
ALT: 7 U/L (ref 0–35)
AST: 13 U/L (ref 0–37)
Calcium: 9.4 mg/dL (ref 8.4–10.5)
GFR calc Af Amer: >90 mL/min (ref >90)
Sodium: 136 mEq/L (ref 135–145)
Total Protein: 6.6 g/dL (ref 6.0–8.3)

## 2012-07-28 LAB — TYPE AND SCREEN: Antibody Screen: NEGATIVE

## 2012-07-28 LAB — CBC
MCH: 30.9 pg (ref 26.0–34.0)
MCHC: 35.8 g/dL (ref 30.0–36.0)
Platelets: 260 10*3/uL (ref 150–400)

## 2012-07-28 LAB — URINALYSIS, ROUTINE W REFLEX MICROSCOPIC
Ketones, ur: NEGATIVE mg/dL
Nitrite: POSITIVE — AB
Specific Gravity, Urine: 1.03 (ref 1.005–1.030)
Urobilinogen, UA: 1 mg/dL (ref 0.0–1.0)
pH: 5.5 (ref 5.0–8.0)

## 2012-07-28 LAB — URINE MICROSCOPIC-ADD ON

## 2012-07-28 MED ORDER — HYDROMORPHONE HCL PF 1 MG/ML IJ SOLN
1.0000 mg | INTRAMUSCULAR | Status: DC | PRN
  Administered 2012-07-28 – 2012-07-30 (×4): 1 mg via INTRAVENOUS
  Filled 2012-07-28 (×4): qty 1

## 2012-07-28 MED ORDER — IOHEXOL 300 MG/ML  SOLN
100.0000 mL | Freq: Once | INTRAMUSCULAR | Status: AC | PRN
  Administered 2012-07-28: 100 mL via INTRAVENOUS

## 2012-07-28 MED ORDER — ONDANSETRON HCL 4 MG/2ML IJ SOLN
4.0000 mg | Freq: Once | INTRAMUSCULAR | Status: AC
  Administered 2012-07-28: 4 mg via INTRAVENOUS
  Filled 2012-07-28: qty 2

## 2012-07-28 MED ORDER — SODIUM CHLORIDE 0.9 % IV BOLUS (SEPSIS)
1000.0000 mL | Freq: Once | INTRAVENOUS | Status: AC
  Administered 2012-07-28: 1000 mL via INTRAVENOUS

## 2012-07-28 MED ORDER — HYDROMORPHONE HCL PF 1 MG/ML IJ SOLN
1.0000 mg | Freq: Once | INTRAMUSCULAR | Status: AC
  Administered 2012-07-28: 1 mg via INTRAVENOUS
  Filled 2012-07-28: qty 1

## 2012-07-28 MED ORDER — SODIUM CHLORIDE 0.9 % IV SOLN
INTRAVENOUS | Status: DC
  Administered 2012-07-29 – 2012-08-01 (×6): via INTRAVENOUS

## 2012-07-28 MED ORDER — PANTOPRAZOLE SODIUM 40 MG PO TBEC
40.0000 mg | DELAYED_RELEASE_TABLET | Freq: Every day | ORAL | Status: DC
  Administered 2012-07-29: 40 mg via ORAL
  Filled 2012-07-28 (×2): qty 1

## 2012-07-28 MED ORDER — METRONIDAZOLE IN NACL 5-0.79 MG/ML-% IV SOLN
500.0000 mg | Freq: Three times a day (TID) | INTRAVENOUS | Status: DC
  Administered 2012-07-28 – 2012-07-31 (×8): 500 mg via INTRAVENOUS
  Filled 2012-07-28 (×11): qty 100

## 2012-07-28 MED ORDER — DEXTROSE 5 % IV SOLN
1.0000 g | Freq: Once | INTRAVENOUS | Status: AC
  Administered 2012-07-28: 1 g via INTRAVENOUS
  Filled 2012-07-28: qty 10

## 2012-07-28 MED ORDER — SODIUM CHLORIDE 0.9 % IJ SOLN
3.0000 mL | Freq: Two times a day (BID) | INTRAMUSCULAR | Status: DC
  Administered 2012-07-31 – 2012-08-02 (×3): 3 mL via INTRAVENOUS

## 2012-07-28 MED ORDER — ONDANSETRON HCL 4 MG/2ML IJ SOLN
4.0000 mg | Freq: Four times a day (QID) | INTRAMUSCULAR | Status: DC | PRN
Start: 2012-07-28 — End: 2012-08-02
  Administered 2012-07-29 – 2012-08-01 (×7): 4 mg via INTRAVENOUS
  Filled 2012-07-28 (×8): qty 2

## 2012-07-28 MED ORDER — DEXTROSE 5 % IV SOLN
1.0000 g | INTRAVENOUS | Status: DC
  Administered 2012-07-28 – 2012-07-30 (×3): 1 g via INTRAVENOUS
  Filled 2012-07-28 (×3): qty 10

## 2012-07-28 NOTE — ED Notes (Signed)
Bed:WA14<BR> Expected date:<BR> Expected time:<BR> Means of arrival:<BR> Comments:<BR> EMS

## 2012-07-28 NOTE — ED Notes (Signed)
Pt report given to [place], Charity fundraiser on [address]

## 2012-07-28 NOTE — ED Provider Notes (Addendum)
History     CSN: [id number]  Arrival date & time 07/28/12  1128   First MD Initiated Contact with Patient 07/28/12 1218      Chief Complaint  Patient presents with  . Rectal Bleeding    (Consider location/radiation/quality/duration/timing/severity/associated sxs/prior treatment) The history is provided by the patient and the spouse.   patient is a 71 year old female presents today for red blood in her bowel movements the patient became ill 4 days ago when on Friday she developed a lower quadrant on the left side abdominal pain on Saturday she developed vomiting and diarrhea his had the several episodes of vomiting and diarrhea since then by mouth intake. Also associated with a fever. Past medical history significant for GI bleed in the past but that was different it was just bleeding cannot have these other symptoms. Patient's primary care Dr. is Dr. Jarold Motto. The abdominal pain is sharp and intermittent all over the abdomen mostly on the left side. The blood was red she noticed some small clots. Pain is nonradiating described as crampy and sharp at its worst is 10 out of 10 on presentation the emergency department it was 7/10.  Past Medical History  Diagnosis Date  . GI bleed   . Acid reflux   . Anxiety   . Depression     History reviewed. No pertinent past surgical history.  No family history on file.  History  Substance Use Topics  . Smoking status: Never Smoker   . Smokeless tobacco: Not on file  . Alcohol Use: No    OB History    Grav Para Term Preterm Abortions TAB SAB Ect Mult Living                  Review of Systems  Constitutional: Positive for fever.  HENT: Negative for neck pain.   Respiratory: Negative for shortness of breath.   Cardiovascular: Negative for chest pain.  Gastrointestinal: Positive for nausea, vomiting, abdominal pain, diarrhea and blood in stool.  Genitourinary: Negative for dysuria.  Musculoskeletal: Negative for back pain.  Skin:  Negative for rash.  Neurological: Negative for headaches.  Hematological: Does not bruise/bleed easily.    Allergies  Aspirin; Codeine; Ether; and Soma  Home Medications   Current Outpatient Rx  Name Route Sig Dispense Refill  . LORAZEPAM 0.5 MG PO TABS Oral Take 0.5-1 mg by mouth at bedtime.    Marland Kitchen MINOXIDIL 2.5 MG PO TABS Oral Take 2.5 mg by mouth daily.    . ADULT MULTIVITAMIN W/MINERALS CH Oral Take 1 tablet by mouth daily.    . OMEGA-3-ACID ETHYL ESTERS 1 G PO CAPS Oral Take 2 g by mouth 2 (two) times daily.    Marland Kitchen OMEPRAZOLE 20 MG PO CPDR Oral Take 20 mg by mouth daily.    . OXYBUTYNIN CHLORIDE ER 5 MG PO TB24 Oral Take 5 mg by mouth 2 (two) times daily.    Marland Kitchen PROPRANOLOL HCL 10 MG PO TABS Oral Take 10 mg by mouth daily.    . VENLAFAXINE HCL ER 75 MG PO CP24 Oral Take 75 mg by mouth every other day.    Marland Kitchen VITAMIN D (ERGOCALCIFEROL) 50000 UNITS PO CAPS Oral Take 50,000 Units by mouth every 7 (seven) days. Every Wednesday      BP 107/65  Pulse 105  Temp 99.8 F (37.7 C) (Oral)  Resp 21  SpO2 93%  LMP 07/28/2012  Physical Exam  Nursing note and vitals reviewed. Constitutional: She is oriented to person, place,  and time. She appears well-developed and well-nourished.  HENT:  Head: Normocephalic and atraumatic.       Mucous membranes are dry.  Eyes: Conjunctivae and EOM are normal. Pupils are equal, round, and reactive to light.  Neck: Normal range of motion. Neck supple.  Cardiovascular: Normal rate, regular rhythm and normal heart sounds.   No murmur heard. Pulmonary/Chest: Effort normal and breath sounds normal. She has no rales.  Abdominal: Soft. Bowel sounds are normal. There is tenderness.       Tender left side of the abdomen.  Musculoskeletal: Normal range of motion. She exhibits no edema.  Neurological: She is alert and oriented to person, place, and time. No cranial nerve deficit. She exhibits normal muscle tone. Coordination normal.  Skin: Skin is warm. No rash  noted.    ED Course  Procedures (including critical care time)  Labs Reviewed  CBC - Abnormal; Notable for the following:    WBC 24.7 (*)     RBC 5.56 (*)     Hemoglobin 17.2 (*)     HCT 48.1 (*)     All other components within normal limits  COMPREHENSIVE METABOLIC PANEL - Abnormal; Notable for the following:    Glucose, Bld 185 (*)     BUN 25 (*)     Albumin 3.2 (*)     GFR calc non Af Amer 86 (*)     All other components within normal limits  URINALYSIS, ROUTINE W REFLEX MICROSCOPIC - Abnormal; Notable for the following:    Color, Urine AMBER (*)  BIOCHEMICALS MAY BE AFFECTED BY COLOR   APPearance CLOUDY (*)     Bilirubin Urine SMALL (*)     Protein, ur 30 (*)     Nitrite POSITIVE (*)     Leukocytes, UA SMALL (*)     All other components within normal limits  URINE MICROSCOPIC-ADD ON - Abnormal; Notable for the following:    Squamous Epithelial / LPF FEW (*)     Bacteria, UA MANY (*)     All other components within normal limits  LIPASE, BLOOD - Abnormal; Notable for the following:    Lipase 10 (*)     All other components within normal limits  TYPE AND SCREEN  URINE CULTURE   No results found. Results for orders placed during the hospital encounter of 07/28/12  CBC      Component Value Range   WBC 24.7 (*) 4.0 - 10.5 K/uL   RBC 5.56 (*) 3.87 - 5.11 MIL/uL   Hemoglobin 17.2 (*) 12.0 - 15.0 g/dL   HCT 81.1 (*) 91.4 - 78.2 %   MCV 86.5  78.0 - 100.0 fL   MCH 30.9  26.0 - 34.0 pg   MCHC 35.8  30.0 - 36.0 g/dL   RDW 95.6  21.3 - 08.6 %   Platelets 260  150 - 400 K/uL  COMPREHENSIVE METABOLIC PANEL      Component Value Range   Sodium 136  135 - 145 mEq/L   Potassium 3.8  3.5 - 5.1 mEq/L   Chloride 100  96 - 112 mEq/L   CO2 23  19 - 32 mEq/L   Glucose, Bld 185 (*) 70 - 99 mg/dL   BUN 25 (*) 6 - 23 mg/dL   Creatinine, Ser 5.78  0.50 - 1.10 mg/dL   Calcium 9.4  8.4 - 46.9 mg/dL   Total Protein 6.6  6.0 - 8.3 g/dL   Albumin 3.2 (*) 3.5 - 5.2 g/dL  AST 13  0 -  37 U/L   ALT 7  0 - 35 U/L   Alkaline Phosphatase 100  39 - 117 U/L   Total Bilirubin 0.8  0.3 - 1.2 mg/dL   GFR calc non Af Amer 86 (*) >90 mL/min   GFR calc Af Amer >90  >90 mL/min  TYPE AND SCREEN      Component Value Range   ABO/RH(D) O POS     Antibody Screen NEG     Sample Expiration 07/31/2012    URINALYSIS, ROUTINE W REFLEX MICROSCOPIC      Component Value Range   Color, Urine AMBER (*) YELLOW   APPearance CLOUDY (*) CLEAR   Specific Gravity, Urine 1.030  1.005 - 1.030   pH 5.5  5.0 - 8.0   Glucose, UA NEGATIVE  NEGATIVE mg/dL   Hgb urine dipstick NEGATIVE  NEGATIVE   Bilirubin Urine SMALL (*) NEGATIVE   Ketones, ur NEGATIVE  NEGATIVE mg/dL   Protein, ur 30 (*) NEGATIVE mg/dL   Urobilinogen, UA 1.0  0.0 - 1.0 mg/dL   Nitrite POSITIVE (*) NEGATIVE   Leukocytes, UA SMALL (*) NEGATIVE  URINE MICROSCOPIC-ADD ON      Component Value Range   Squamous Epithelial / LPF FEW (*) RARE   WBC, UA 7-10  <3 WBC/hpf   Bacteria, UA MANY (*) RARE   Urine-Other MUCOUS PRESENT    LIPASE, BLOOD      Component Value Range   Lipase 10 (*) 11 - 59 U/L     1. Dehydration   2. Rectal bleeding   3. Abdominal pain   4. Urinary tract infection       MDM  The patient is followed by Dr. Jarold Motto believe that Dr. Timothy Lasso was on call for them today patient with onset of vomiting and diarrhea on Saturday 3 days ago and on Friday developed a left quadrant abdominal pain 4 days ago. Today with onset of some rectal bleeding red blood not able to assess how much in the tall order. Associated with fever. Patient has had GI bleeds in the past but that was different. CT scan of the abdomen is still pending H&H shows concentration and dehydration urinalysis consistent with urinary tract infection with a positive nitrite patient received 1 g of Rocephin for that. Patient will most likely require admission for further hydration but cannot arrange admission until CT scan results are  known.           Shelda Jakes, MD 07/28/12 1616  CT scan shows evidence of colitis which accounts for her diarrhea and bloody diarrhea. Abdominal exam was repeated and she does not show evidence of an acute surgical abdomen. Case is discussed with Dr. Timothy Lasso who is on call for Dr. Eloise Harman and Dr. Timothy Lasso states she will come to the emergency department to admit the patient.  Results for orders placed during the hospital encounter of 07/28/12  CBC      Component Value Range   WBC 24.7 (*) 4.0 - 10.5 K/uL   RBC 5.56 (*) 3.87 - 5.11 MIL/uL   Hemoglobin 17.2 (*) 12.0 - 15.0 g/dL   HCT 16.1 (*) 09.6 - 04.5 %   MCV 86.5  78.0 - 100.0 fL   MCH 30.9  26.0 - 34.0 pg   MCHC 35.8  30.0 - 36.0 g/dL   RDW 40.9  81.1 - 91.4 %   Platelets 260  150 - 400 K/uL  COMPREHENSIVE METABOLIC PANEL  Component Value Range   Sodium 136  135 - 145 mEq/L   Potassium 3.8  3.5 - 5.1 mEq/L   Chloride 100  96 - 112 mEq/L   CO2 23  19 - 32 mEq/L   Glucose, Bld 185 (*) 70 - 99 mg/dL   BUN 25 (*) 6 - 23 mg/dL   Creatinine, Ser 9.60  0.50 - 1.10 mg/dL   Calcium 9.4  8.4 - 45.4 mg/dL   Total Protein 6.6  6.0 - 8.3 g/dL   Albumin 3.2 (*) 3.5 - 5.2 g/dL   AST 13  0 - 37 U/L   ALT 7  0 - 35 U/L   Alkaline Phosphatase 100  39 - 117 U/L   Total Bilirubin 0.8  0.3 - 1.2 mg/dL   GFR calc non Af Amer 86 (*) >90 mL/min   GFR calc Af Amer >90  >90 mL/min  TYPE AND SCREEN      Component Value Range   ABO/RH(D) O POS     Antibody Screen NEG     Sample Expiration 07/31/2012    URINALYSIS, ROUTINE W REFLEX MICROSCOPIC      Component Value Range   Color, Urine AMBER (*) YELLOW   APPearance CLOUDY (*) CLEAR   Specific Gravity, Urine 1.030  1.005 - 1.030   pH 5.5  5.0 - 8.0   Glucose, UA NEGATIVE  NEGATIVE mg/dL   Hgb urine dipstick NEGATIVE  NEGATIVE   Bilirubin Urine SMALL (*) NEGATIVE   Ketones, ur NEGATIVE  NEGATIVE mg/dL   Protein, ur 30 (*) NEGATIVE mg/dL   Urobilinogen, UA 1.0  0.0 - 1.0 mg/dL    Nitrite POSITIVE (*) NEGATIVE   Leukocytes, UA SMALL (*) NEGATIVE  URINE MICROSCOPIC-ADD ON      Component Value Range   Squamous Epithelial / LPF FEW (*) RARE   WBC, UA 7-10  <3 WBC/hpf   Bacteria, UA MANY (*) RARE   Urine-Other MUCOUS PRESENT    LIPASE, BLOOD      Component Value Range   Lipase 10 (*) 11 - 59 U/L   Ct Abdomen Pelvis W Contrast  07/28/2012  *RADIOLOGY REPORT*  Clinical Data: Rectal bleeding, abdominal pain, nausea/vomiting/diarrhea  CT ABDOMEN AND PELVIS WITH CONTRAST  Technique:  Multidetector CT imaging of the abdomen and pelvis was performed following the standard protocol during bolus administration of intravenous contrast.  Contrast: OMNIPAQUE IOHEXOL 300 MG/ML  SOLN  Comparison: None.  Findings: Trace bilateral pleural effusions with associated atelectasis.  Calcified granuloma at the right lung base.  Bilateral breast augmentation.  Moderate hiatal hernia.  Surgical clips at the GE junction.  Liver is unremarkable.  Calcified splenic granulomata.  Pancreas and adrenal glands are within normal limits.  Gallbladder is unremarkable.  No associated inflammatory changes by CT.  No intrahepatic or extrahepatic ductal dilatation.  Kidneys are within normal limits.  No hydronephrosis.  No evidence of bowel obstruction.  Normal appendix.  Colonic wall thickening and inflammatory changes involving a long segment of the distal transverse, descending, proximal sigmoid colon, compatible with infectious/inflammatory colitis.  No drainable fluid collection or abscess.  No free air.  Atherosclerotic calcifications of the abdominal aorta and branch vessels.  Small volume pelvic ascites.  No suspicious abdominopelvic lymphadenopathy.  Status post hysterectomy.  Ovaries are unremarkable.  Bladder is within normal limits.  Degenerative changes of the visualized thoracolumbar spine. Benign- appearing sclerotic lesions in the right T10 vertebral body (series 2/image 8) and left posterior  acetabulum (  series 2/image 76). Additional sclerotic lesion in the right sacrum (series 2/image 58), nonspecific.  IMPRESSION:  Wall thickening/inflammatory changes extending from the distal transverse to the proximal sigmoid colon, compatible with infectious/inflammatory colitis.  No drainable fluid collection or abscess.  No free air.  Original Report Authenticated By: Charline Bills, M.D.      Dione Booze, MD 07/28/12 1747  Dione Booze, MD 07/28/12 [id number]

## 2012-07-28 NOTE — ED Notes (Signed)
ONG:[id number]<WU> Expected date:07/28/12<BR> Expected time:11:13 AM<BR> Means of arrival:Ambulance<BR> Comments:<BR> Rectal bleeding

## 2012-07-28 NOTE — Progress Notes (Signed)
[name], Alison Mcguire confirmed to be pt pcp She states he is on vacation this week

## 2012-07-28 NOTE — ED Notes (Signed)
Pt presenting to ed with c/o abdominal pain with nausea, vomiting and diarrhea. Pt states she has had rectal bleeding that started dark red and now it's bright red with small clots. Pt is alert and oriented at this time.

## 2012-07-28 NOTE — ED Notes (Signed)
Admitting md at bedside

## 2012-07-28 NOTE — H&P (Signed)
Alison Mcguire is an 71 y.o. female.   PCP:   Garlan Fillers, MD   Chief Complaint:  Bloody Diarrhea, Colitis, Leukocytosis, UTI  HPI: 48 F  sent to ED with a stomach ache since Friday, fever on Sun, N/V/D, generalized weakness and Ab Pain. Diarrhea is profuse bloody and watery. She had kept down some soup on Sat.  Last vomited yesterday.  Having 10-15 episodes of diarrhea/day.  Able to drink fluids and keep down, but not enough.  We sent her to the ED b/c she was still having terrible abd pain and low grade fever.   In ED She had leukocytosis, a UTI and CT C/w Colitis.  Rxed with Rocephin. Called to admit. She is feeling some better post some rxs.    Past Medical History:  Past Medical History  Diagnosis Date  . GI bleed   . Acid reflux   . Anxiety   . Depression    November 2005 Chest Pain, atypical with Cardiolite exercise test normal GERD, achalasia with distal esophageal stricture Urinary Incontinence Memory Loss Insomnia, chronic Cough DM2 11/04, by 2013 resolved with diet and exercise Hyperlipidemia Depression January 2010 vascular surgery evaluation of right leg pain with no significant arterial or venous disease found  Physicians involved in care: Dr. Bjorn Pippin (urol), Dr. Rodrigo Ran (opt), Ritta Slot (GI), Ruthann Cancer (hemonc), Tawanna Cooler Early (vsurg), Coralee North (tsurg), [facility] and Meriam Sprague (gsurg), Ronny Flurry (cards gxt)  Surgical History (reviewed - no changes required): 1954 T&A 1970 Vaginal  Hysterectomy 1974 B mastectomy (B9, Fibrocystic) 1982 R breast implant 2002 Breast implant 1/06 left thoracotomy with Harvest Dark IV Fundoplication and removal esophageal diverticulum June 2006 endoscopy with savory dilatation distal esophageal stricture June 2010 emergency colonoscopy showed cecal ulcer with visible vessel and right and left mild diverticulosis August 2010 followup colonoscopy showed healing of cecal ulcer with followup  in 5 years recommended   History reviewed. No pertinent past surgical history.    Allergies:   Allergies  Allergen Reactions  . Aspirin   . Codeine   . Ether   . Soma (Carisoprodol)    ASA, Demerol, soma, ether, clinoril, bandaids, betadine, codeine  Medications: Prior to Admission medications   Medication Sig Start Date End Date Taking? Authorizing Provider  LORazepam (ATIVAN) 0.5 MG tablet Take 0.5-1 mg by mouth at bedtime.   Yes Historical Provider, MD  minoxidil (LONITEN) 2.5 MG tablet Take 2.5 mg by mouth daily.   Yes Historical Provider, MD  Multiple Vitamin (MULTIVITAMIN WITH MINERALS) TABS Take 1 tablet by mouth daily.   Yes Historical Provider, MD  omega-3 acid ethyl esters (LOVAZA) 1 G capsule Take 2 g by mouth 2 (two) times daily.   Yes Historical Provider, MD  omeprazole (PRILOSEC) 20 MG capsule Take 20 mg by mouth daily.   Yes Historical Provider, MD  oxybutynin (DITROPAN-XL) 5 MG 24 hr tablet Take 5 mg by mouth 2 (two) times daily.   Yes Historical Provider, MD  propranolol (INDERAL) 10 MG tablet Take 10 mg by mouth daily.   Yes Historical Provider, MD  venlafaxine XR (EFFEXOR-XR) 75 MG 24 hr capsule Take 75 mg by mouth every other day.   Yes Historical Provider, MD  Vitamin D, Ergocalciferol, (DRISDOL) 50000 UNITS CAPS Take 50,000 Units by mouth every 7 (seven) days. Every Wednesday   Yes Historical Provider, MD   1)  Pravastatin Sodium 20 Mg Tabs (Pravastatin sodium) .... Take one tablet by mouth once daily 2)  Effexor Xr 37.5 Mg Xr24h-cap (Venlafaxine hcl) .... Take 3 capsules by mouth once daily for 14 days then 2 capsules by mouth once daily 3)  Ativan 0.5 Mg Tabs (Lorazepam) .... Take 2 tablets by mouth at bedtime as needed for sleep or anxiety 4)  Oxybutynin Chloride 10 Mg Xr24h-tab (Oxybutynin chloride) .... Take one tablet by mouth daily 5)  Glucosamine-chondroitin Caps (Glucosamine-chondroit-vit c-mn) .... 2 caps po qd 6)  Multivitamins Tabs (Multiple  vitamin) .... Take one tablet by mouth every day 7)  Tylenol Extra Strength 500 Mg Tabs (Acetaminophen) .... Prn 8)  Vitamin D (ergocalciferol) 50000 Unit Caps (Ergocalciferol) .... Take one tablet once weekly 9)  Propranolol Hcl 10 Mg Tabs (Propranolol hcl) .... Take one tablet daily for anxiety 10)  Minoxidil 2.5 Mg Tabs (Minoxidil) .... Take one tablet at bedtime 11)  Prilosec 20 Mg Cpdr (Omeprazole) .... Take one tablet by mouth daily 12)  Fish Oil 1000 Mg Caps (Omega-3 fatty acids) .... Take 2 capsules by mouth twice daily 13)  Caltrate 600+d Tabs (Calcium carbonate-vitamin d tabs) .... Take one tablet by mouth twice daily    (Not in a hospital admission)   Social History:  reports that she has never smoked. She does not have any smokeless tobacco history on file. She reports that she does not drink alcohol or use illicit drugs. Married, 3 children, Resgistered Nurse, [place], [place], some marital discord. she takes care of [name], a 50 year old grandson during the daytime during the week.    Family History: No family history on file. Father (D) 52, MVA Mother (D) 101, Cervical CA Siblings, 4 brothers Children, 2 sons & 1 daughter  Review of Systems:  Review of Systems - Negative except whats in HPI. No CP SOB ENT issues x dry. + Ab discomfort. Weakness. No Urinary Sxs.   Physical Exam:  Blood pressure 104/68, pulse 114, temperature 98.6 F (37 C), temperature source Oral, resp. rate 17, last menstrual period 07/28/2012, SpO2 88.00%. Filed Vitals:   07/28/12 1500 07/28/12 1638 07/28/12 1730 07/28/12 1830  BP: 107/65 126/70 125/71 104/68  Pulse: 105 111 111 114  Temp:  98.6 F (37 C)    TempSrc:  Oral    Resp: 21 21 27 17   SpO2: 93% 97% 92% 88%   General appearance: A and O. Looks better than expected. Head: Normocephalic, without obvious abnormality, atraumatic Eyes: conjunctivae/corneas clear. PERRL, EOM's intact.  Nose: Nares normal. Septum midline. Mucosa normal. No  drainage or sinus tenderness. Throat: lips dry, mucosa, and tongue dry; teeth and gums normal = braces Neck: no adenopathy, no carotid bruit, no JVD and thyroid not enlarged, symmetric, no tenderness/mass/nodules Resp: CTA B Cardio: Reg s m GI: soft, Mild diffuse tender L>R; bowel sounds a little distant; no masses,  no organomegaly Extremities: extremities normal, atraumatic, no cyanosis or edema Pulses: 2+ and symmetric Neurologic: Alert and oriented X 3, normal strength and tone. Normal symmetric reflexes.     Labs on Admission:   [facility] 07/28/12 1150  NA 136  K 3.8  CL 100  CO2 23  GLUCOSE 185*  BUN 25*  CREATININE 0.70  CALCIUM 9.4  MG --  PHOS --    Basename 07/28/12 1150  AST 13  ALT 7  ALKPHOS 100  BILITOT 0.8  PROT 6.6  ALBUMIN 3.2*    Basename 07/28/12 1150  LIPASE 10*  AMYLASE --    Basename 07/28/12 1150  WBC 24.7*  NEUTROABS --  HGB 17.2*  HCT 48.1*  MCV 86.5  PLT 260   No results found for this basename: CKTOTAL:3,CKMB:3,CKMBINDEX:3,TROPONINI:3 in the last 72 hours Lab Results  Component Value Date   INR 1.1 06/20/2009     LAB RESULT POCT:  Results for orders placed during the hospital encounter of 07/28/12  CBC      Component Value Range   WBC 24.7 (*) 4.0 - 10.5 K/uL   RBC 5.56 (*) 3.87 - 5.11 MIL/uL   Hemoglobin 17.2 (*) 12.0 - 15.0 g/dL   HCT 16.1 (*) 09.6 - 04.5 %   MCV 86.5  78.0 - 100.0 fL   MCH 30.9  26.0 - 34.0 pg   MCHC 35.8  30.0 - 36.0 g/dL   RDW 40.9  81.1 - 91.4 %   Platelets 260  150 - 400 K/uL  COMPREHENSIVE METABOLIC PANEL      Component Value Range   Sodium 136  135 - 145 mEq/L   Potassium 3.8  3.5 - 5.1 mEq/L   Chloride 100  96 - 112 mEq/L   CO2 23  19 - 32 mEq/L   Glucose, Bld 185 (*) 70 - 99 mg/dL   BUN 25 (*) 6 - 23 mg/dL   Creatinine, Ser 7.82  0.50 - 1.10 mg/dL   Calcium 9.4  8.4 - 95.6 mg/dL   Total Protein 6.6  6.0 - 8.3 g/dL   Albumin 3.2 (*) 3.5 - 5.2 g/dL   AST 13  0 - 37 U/L   ALT 7  0 - 35  U/L   Alkaline Phosphatase 100  39 - 117 U/L   Total Bilirubin 0.8  0.3 - 1.2 mg/dL   GFR calc non Af Amer 86 (*) >90 mL/min   GFR calc Af Amer >90  >90 mL/min  TYPE AND SCREEN      Component Value Range   ABO/RH(D) O POS     Antibody Screen NEG     Sample Expiration 07/31/2012    URINALYSIS, ROUTINE W REFLEX MICROSCOPIC      Component Value Range   Color, Urine AMBER (*) YELLOW   APPearance CLOUDY (*) CLEAR   Specific Gravity, Urine 1.030  1.005 - 1.030   pH 5.5  5.0 - 8.0   Glucose, UA NEGATIVE  NEGATIVE mg/dL   Hgb urine dipstick NEGATIVE  NEGATIVE   Bilirubin Urine SMALL (*) NEGATIVE   Ketones, ur NEGATIVE  NEGATIVE mg/dL   Protein, ur 30 (*) NEGATIVE mg/dL   Urobilinogen, UA 1.0  0.0 - 1.0 mg/dL   Nitrite POSITIVE (*) NEGATIVE   Leukocytes, UA SMALL (*) NEGATIVE  URINE MICROSCOPIC-ADD ON      Component Value Range   Squamous Epithelial / LPF FEW (*) RARE   WBC, UA 7-10  <3 WBC/hpf   Bacteria, UA MANY (*) RARE   Urine-Other MUCOUS PRESENT    LIPASE, BLOOD      Component Value Range   Lipase 10 (*) 11 - 59 U/L      Radiological Exams on Admission: Ct Abdomen Pelvis W Contrast  07/28/2012  *RADIOLOGY REPORT*  Clinical Data: Rectal bleeding, abdominal pain, nausea/vomiting/diarrhea  CT ABDOMEN AND PELVIS WITH CONTRAST  Technique:  Multidetector CT imaging of the abdomen and pelvis was performed following the standard protocol during bolus administration of intravenous contrast.  Contrast: OMNIPAQUE IOHEXOL 300 MG/ML  SOLN  Comparison: None.  Findings: Trace bilateral pleural effusions with associated atelectasis.  Calcified granuloma at the right lung base.  Bilateral breast augmentation.  Moderate hiatal hernia.  Surgical clips at the GE junction.  Liver is unremarkable.  Calcified splenic granulomata.  Pancreas and adrenal glands are within normal limits.  Gallbladder is unremarkable.  No associated inflammatory changes by CT.  No intrahepatic or extrahepatic ductal  dilatation.  Kidneys are within normal limits.  No hydronephrosis.  No evidence of bowel obstruction.  Normal appendix.  Colonic wall thickening and inflammatory changes involving a long segment of the distal transverse, descending, proximal sigmoid colon, compatible with infectious/inflammatory colitis.  No drainable fluid collection or abscess.  No free air.  Atherosclerotic calcifications of the abdominal aorta and branch vessels.  Small volume pelvic ascites.  No suspicious abdominopelvic lymphadenopathy.  Status post hysterectomy.  Ovaries are unremarkable.  Bladder is within normal limits.  Degenerative changes of the visualized thoracolumbar spine. Benign- appearing sclerotic lesions in the right T10 vertebral body (series 2/image 8) and left posterior acetabulum (series 2/image 76). Additional sclerotic lesion in the right sacrum (series 2/image 58), nonspecific.  IMPRESSION:  Wall thickening/inflammatory changes extending from the distal transverse to the proximal sigmoid colon, compatible with infectious/inflammatory colitis.  No drainable fluid collection or abscess.  No free air.  Original Report Authenticated By: Charline Bills, M.D.      No orders found for this or any previous visit.   Assessment/Plan Principal Problem:  *Colitis Active Problems:  Bloody diarrhea  Abdominal pain  GERD (gastroesophageal reflux disease)  Admit for L sided colitis and Lower GIB presumed Ischemic colitis but will treat for infectious until ruled out by Stool studies and or colonoscopy/Sigmoidoscopy.  H/O Cecal ulcer in June 2010.  Unliklely to be IBD. I called Dr Kinnie Scales and he will see the pt in am. The pt is already some better after fluids and did have Rocephin for ? UTI. Will add flagyl. Will get stool studies. NPO x sips and chips. IVF Sxatic Rx. No usual exposure x last week was changing a baby with Diarrhea. Stool studies sent. ? UTI - On Abx - await Cx. Follow Hbg - expect it to drop as  she is hemo-concentrated. She is already Typed and screened. Continue IVF for her underlying dehydration. Follow up Leukocytosis in am.   2. DIABETES MELLITUS, TYPE II (ICD-250.00) She has lost weight and now has great A1C.  She is diet controlled. CBG 185 and may just be stress Rx. Follow. May need CBGs and ISS.  3.  GERD (ICD-530.81) - GERD symptoms are well controlled on Prilosec.  XContinue PPI  4  HYPERLIPIDEMIA (ICD-272.4) - currently on Pravastatin.  Hold in hospital.  5 Hold antidepressants until better.  6 SCDs for DVT Proph   Leomar Westberg M 07/28/2012, 6:52 PM

## 2012-07-29 LAB — BASIC METABOLIC PANEL
BUN: 20 mg/dL (ref 6–23)
GFR calc Af Amer: >90 mL/min (ref >90)
GFR calc non Af Amer: 88 mL/min — ABNORMAL LOW (ref >90)
Potassium: 3.8 mEq/L (ref 3.5–5.1)
Sodium: 136 mEq/L (ref 135–145)

## 2012-07-29 LAB — CBC
Hemoglobin: 13.1 g/dL (ref 12.0–15.0)
MCHC: 33.6 g/dL (ref 30.0–36.0)
Platelets: 225 10*3/uL (ref 150–400)
RBC: 4.42 MIL/uL (ref 3.87–5.11)

## 2012-07-29 LAB — PROTIME-INR
INR: 1.15 (ref 0.00–1.49)
Prothrombin Time: 14.9 seconds (ref 11.6–15.2)

## 2012-07-29 MED ORDER — VENLAFAXINE HCL ER 75 MG PO CP24
75.0000 mg | ORAL_CAPSULE | ORAL | Status: DC
  Administered 2012-07-29 – 2012-08-01 (×2): 75 mg via ORAL
  Filled 2012-07-29 (×2): qty 1

## 2012-07-29 MED ORDER — PANTOPRAZOLE SODIUM 40 MG IV SOLR
40.0000 mg | Freq: Two times a day (BID) | INTRAVENOUS | Status: DC
  Administered 2012-07-29 – 2012-07-30 (×4): 40 mg via INTRAVENOUS
  Filled 2012-07-29 (×6): qty 40

## 2012-07-29 MED ORDER — PANTOPRAZOLE SODIUM 40 MG PO TBEC: 40.0000 mg | DELAYED_RELEASE_TABLET | Freq: Every day | ORAL | Status: DC

## 2012-07-29 MED ORDER — LORAZEPAM 2 MG/ML IJ SOLN: 0.5000 mg | Freq: Four times a day (QID) | INTRAMUSCULAR | Status: DC | PRN

## 2012-07-29 NOTE — Progress Notes (Signed)
Patient complained of burning in esophogus then vomitted small amount (<50cc) of emesis-bile colored. Protonix PO given early along with dilaudid IV and zofran IV. Felt somewhat better after vomitting. Will continue to monitor. Ginny Forth

## 2012-07-29 NOTE — Progress Notes (Signed)
   CARE MANAGEMENT NOTE 07/29/2012  Patient:  Alison Mcguire, Alison Mcguire   Account Number:  [id number]  Date Initiated:  07/29/2012  Documentation initiated by:  Jiles Crocker  Subjective/Objective Assessment:   ADMITTED WITH COLITIS, BLOODY DIARRHEA     Action/Plan:   PCP: Garlan Fillers, MD;   LIVES AT HOME WITH SPOUSE   Anticipated DC Date:  08/05/2012   Anticipated DC Plan:  HOME/SELF CARE      DC Planning Services  CM consult               Status of service:  In process, will continue to follow Medicare Important Message given?  NA - LOS <3 / Initial given by admissions (If response is "NO", the following Medicare IM given date fields will be blank)  Per UR Regulation:  Reviewed for med. necessity/level of care/duration of stay  Comments:  07/29/2012- B Jolynn Bajorek RN, BSN, MHA

## 2012-07-29 NOTE — Progress Notes (Signed)
Subjective: Admittrd for L sided colitis and Lower GIB presumed Ischemic colitis but on Rx for infectious until ruled out by Stool studies and or colonoscopy/Sigmoidoscopy. H/O Cecal ulcer in June 2010. Unliklely to be IBD.  Dr Kinnie Scales to see pt today. On Rocephin/Flagyl/IVF Await stool studies. NPO x sips and chips.  Sxatic Rx on order.  ? UTI - On Abx - await Cx.  Some N/V last night  Some increased Tenderness. Looks a little sicker but she is OK this am.     Objective: Vital signs in last 24 hours: Temp:  [98.2 F (36.8 C)-99.8 F (37.7 C)] 98.2 F (36.8 C) (08/06 0529) Pulse Rate:  [96-124] 106  (08/06 0529) Resp:  [15-27] 18  (08/06 0529) BP: (92-126)/(53-76) 92/53 mmHg (08/06 0529) SpO2:  [88 %-98 %] 95 % (08/06 0529) Weight:  [71.6 kg (157 lb 13.6 oz)] 71.6 kg (157 lb 13.6 oz) (08/05 2026) Weight change:  Last BM Date: 07/28/12  CBG (last 3)  No results found for this basename: GLUCAP:3 in the last 72 hours  Intake/Output from previous day:  Intake/Output Summary (Last 24 hours) at 07/29/12 0705 Last data filed at 07/29/12 0600  Gross per 24 hour  Intake   1200 ml  Output    500 ml  Net    700 ml   08/05 0701 - 08/06 0700 In: 1200 [I.V.:1050; IV Piggyback:150] Out: 500 [Urine:500]   Physical Exam  General appearance: A and O Eyes: no scleral icterus Throat: oropharynx moist without erythema Resp: Pulm Cardio: Reg GI: soft, Diffuse tender s R/G; bowel sounds diminished: no masses,  no organomegaly Extremities: no clubbing, cyanosis or edema   Lab Results:  Basename 07/29/12 0424 07/28/12 1150  NA 136 136  K 3.8 3.8  CL 103 100  CO2 25 23  GLUCOSE 121* 185*  BUN 20 25*  CREATININE 0.65 0.70  CALCIUM 8.0* 9.4  MG -- --  PHOS -- --     Basename 07/28/12 1150  AST 13  ALT 7  ALKPHOS 100  BILITOT 0.8  PROT 6.6  ALBUMIN 3.2*     Basename 07/29/12 0424 07/28/12 1150  WBC 15.4* 24.7*  NEUTROABS -- --  HGB 13.1 17.2*  HCT 39.0 48.1*    MCV 88.2 86.5  PLT 225 260    Lab Results  Component Value Date   INR 1.15 07/29/2012   INR 1.1 06/20/2009    No results found for this basename: CKTOTAL:3,CKMB:3,CKMBINDEX:3,TROPONINI:3 in the last 72 hours  No results found for this basename: TSH,T4TOTAL,FREET3,T3FREE,THYROIDAB in the last 72 hours  No results found for this basename: VITAMINB12:2,FOLATE:2,FERRITIN:2,TIBC:2,IRON:2,RETICCTPCT:2 in the last 72 hours  Micro Results: No results found for this or any previous visit (from the past 240 hour(s)).   Studies/Results: Ct Abdomen Pelvis W Contrast  07/28/2012  *RADIOLOGY REPORT*  Clinical Data: Rectal bleeding, abdominal pain, nausea/vomiting/diarrhea  CT ABDOMEN AND PELVIS WITH CONTRAST  Technique:  Multidetector CT imaging of the abdomen and pelvis was performed following the standard protocol during bolus administration of intravenous contrast.  Contrast: OMNIPAQUE IOHEXOL 300 MG/ML  SOLN  Comparison: None.  Findings: Trace bilateral pleural effusions with associated atelectasis.  Calcified granuloma at the right lung base.  Bilateral breast augmentation.  Moderate hiatal hernia.  Surgical clips at the GE junction.  Liver is unremarkable.  Calcified splenic granulomata.  Pancreas and adrenal glands are within normal limits.  Gallbladder is unremarkable.  No associated inflammatory changes by CT.  No intrahepatic or extrahepatic  ductal dilatation.  Kidneys are within normal limits.  No hydronephrosis.  No evidence of bowel obstruction.  Normal appendix.  Colonic wall thickening and inflammatory changes involving a long segment of the distal transverse, descending, proximal sigmoid colon, compatible with infectious/inflammatory colitis.  No drainable fluid collection or abscess.  No free air.  Atherosclerotic calcifications of the abdominal aorta and branch vessels.  Small volume pelvic ascites.  No suspicious abdominopelvic lymphadenopathy.  Status post hysterectomy.  Ovaries are  unremarkable.  Bladder is within normal limits.  Degenerative changes of the visualized thoracolumbar spine. Benign- appearing sclerotic lesions in the right T10 vertebral body (series 2/image 8) and left posterior acetabulum (series 2/image 76). Additional sclerotic lesion in the right sacrum (series 2/image 58), nonspecific.  IMPRESSION:  Wall thickening/inflammatory changes extending from the distal transverse to the proximal sigmoid colon, compatible with infectious/inflammatory colitis.  No drainable fluid collection or abscess.  No free air.  Original Report Authenticated By: Charline Bills, M.D.     Medications: Scheduled:   . cefTRIAXone (ROCEPHIN)  IV  1 g Intravenous Once  . cefTRIAXone (ROCEPHIN)  IV  1 g Intravenous Q24H  . HYDROmorphone  1 mg Intravenous Once  . HYDROmorphone  1 mg Intravenous Once  . metronidazole  500 mg Intravenous Q8H  . ondansetron (ZOFRAN) IV  4 mg Intravenous Once  . ondansetron  4 mg Intravenous Once  . ondansetron  4 mg Intravenous Once  . pantoprazole  40 mg Oral Q1200  . sodium chloride  1,000 mL Intravenous Once  . sodium chloride  3 mL Intravenous Q12H  . DISCONTD: pantoprazole  40 mg Oral Q1200   Continuous:   . sodium chloride 100 mL/hr at 07/29/12 0104     Assessment/Plan: Principal Problem:  *Colitis Active Problems:  Bloody diarrhea  Abdominal pain  GERD (gastroesophageal reflux disease)  L sided colitis/Lower GIB - presumed Ischemic colitis but on treatment for infectious colitis until ruled out by Stool studies and or colonoscopy/Sigmoidoscopy. H/O Cecal ulcer in June 2010. Unliklely to be IBD. Dr Kinnie Scales to see the pt in am.  Continue Rocephin/Flagyl/Fluids and Sx Rx.  NPO x sips and chips. C dif pending. I am seeing pt in room with Dr Kinnie Scales and he strongly suspects Ischemic colitis and agrees with current Rx plan. Colon/sigmoid on hold for now - will need over the next week - two.  Will call him back if pt worsens .  Expect her  to improve slowly.  Increase orals slowly.  ? UTI - On Abx - await Cx.   Follow Hbg - Down to 13.1 with IVF and a little GI Blood loss.. She is already Typed and screened.   Continue IVF for her underlying dehydration.   Leukocytosis in am. 24.7 last night and now 15.7 - some better.  GERD - PPI.  S/P Nissen type procedure and with all vomiting more upper Sxs.  DM2 - Diet controlled as outpatient after sig weight loss.  Glucose this am 121.  Hold CBGs and ssi unless necessary.  DVT Prophylaxis - SCDs  Benzos for sedation/Anxiety prn.    LOS: 1 day   Cadance Raus M 07/29/2012, 7:05 AM

## 2012-07-30 LAB — URINE CULTURE: Colony Count: >=100000

## 2012-07-30 MED ORDER — SUCRALFATE 1 GM/10ML PO SUSP
1.0000 g | Freq: Two times a day (BID) | ORAL | Status: DC
  Administered 2012-07-30 (×2): 1 g via ORAL
  Filled 2012-07-30 (×5): qty 10

## 2012-07-30 NOTE — Progress Notes (Signed)
Pt had 2 bloody stools. MD made aware. Called back with no new orders. Will continue to monitor for increased bleeding and change in Hgb. Julio Sicks RN

## 2012-07-30 NOTE — Progress Notes (Signed)
Subjective: Admitted for L sided colitis and Lower GIB presumed Ischemic colitis but on Rx for infectious until ruled outOn Rocephin/Flagyl/IVF Await stool studies. NPO x sips and chips.  Sxatic Rx on order.  ? UTI - On Abx - await Cx.  Less N/V - But Much more Dyspepsia. Tenderness improving. Not hungry   Objective: Vital signs in last 24 hours: Temp:  [98.3 F (36.8 C)-98.7 F (37.1 C)] 98.7 F (37.1 C) (08/07 0446) Pulse Rate:  [55-86] 74  (08/07 0446) Resp:  [16-18] 18  (08/07 0446) BP: (93-107)/(55-68) 107/68 mmHg (08/07 0446) SpO2:  [95 %-96 %] 96 % (08/07 0446) Weight:  [73.6 kg (162 lb 4.1 oz)] 73.6 kg (162 lb 4.1 oz) (08/07 0500) Weight change: 2 kg (4 lb 6.6 oz) Last BM Date: 07/29/12  CBG (last 3)  No results found for this basename: GLUCAP:3 in the last 72 hours  Intake/Output from previous day:  Intake/Output Summary (Last 24 hours) at 07/30/12 0704 Last data filed at 07/30/12 0600  Gross per 24 hour  Intake   1200 ml  Output    425 ml  Net    775 ml   08/06 0701 - 08/07 0700 In: 1200 [I.V.:1050; IV Piggyback:150] Out: 425 [Urine:425]   Physical Exam  General appearance: A and O sitting up. Eyes: no scleral icterus Throat: oropharynx moist without erythema Resp: Pulm Cardio: Reg GI: soft, Diffuse tender s R/G; bowel sounds diminished: no masses,  no organomegaly Extremities: no clubbing, cyanosis or edema   Lab Results:  Basename 07/29/12 0424 07/28/12 1150  NA 136 136  K 3.8 3.8  CL 103 100  CO2 25 23  GLUCOSE 121* 185*  BUN 20 25*  CREATININE 0.65 0.70  CALCIUM 8.0* 9.4  MG -- --  PHOS -- --     Basename 07/28/12 1150  AST 13  ALT 7  ALKPHOS 100  BILITOT 0.8  PROT 6.6  ALBUMIN 3.2*     Basename 07/29/12 0424 07/28/12 1150  WBC 15.4* 24.7*  NEUTROABS -- --  HGB 13.1 17.2*  HCT 39.0 48.1*  MCV 88.2 86.5  PLT 225 260    Lab Results  Component Value Date   INR 1.15 07/29/2012   INR 1.1 06/20/2009    No results found  for this basename: CKTOTAL:3,CKMB:3,CKMBINDEX:3,TROPONINI:3 in the last 72 hours  No results found for this basename: TSH,T4TOTAL,FREET3,T3FREE,THYROIDAB in the last 72 hours  No results found for this basename: VITAMINB12:2,FOLATE:2,FERRITIN:2,TIBC:2,IRON:2,RETICCTPCT:2 in the last 72 hours  Micro Results: Recent Results (from the past 240 hour(s))  URINE CULTURE     Status: Normal (Preliminary result)   Collection Time   07/28/12  1:16 PM      Component Value Range Status Comment   Specimen Description URINE, CATHETERIZED   Final    Special Requests NONE   Final    Culture  Setup Time 07/28/2012 23:09   Final    Colony Count >=100,000 COLONIES/ML   Final    Culture ESCHERICHIA COLI   Final    Report Status PENDING   Incomplete      Studies/Results: Ct Abdomen Pelvis W Contrast  07/28/2012  *RADIOLOGY REPORT*  Clinical Data: Rectal bleeding, abdominal pain, nausea/vomiting/diarrhea  CT ABDOMEN AND PELVIS WITH CONTRAST  Technique:  Multidetector CT imaging of the abdomen and pelvis was performed following the standard protocol during bolus administration of intravenous contrast.  Contrast: OMNIPAQUE IOHEXOL 300 MG/ML  SOLN  Comparison: None.  Findings: Trace bilateral pleural effusions with  associated atelectasis.  Calcified granuloma at the right lung base.  Bilateral breast augmentation.  Moderate hiatal hernia.  Surgical clips at the GE junction.  Liver is unremarkable.  Calcified splenic granulomata.  Pancreas and adrenal glands are within normal limits.  Gallbladder is unremarkable.  No associated inflammatory changes by CT.  No intrahepatic or extrahepatic ductal dilatation.  Kidneys are within normal limits.  No hydronephrosis.  No evidence of bowel obstruction.  Normal appendix.  Colonic wall thickening and inflammatory changes involving a long segment of the distal transverse, descending, proximal sigmoid colon, compatible with infectious/inflammatory colitis.  No drainable fluid  collection or abscess.  No free air.  Atherosclerotic calcifications of the abdominal aorta and branch vessels.  Small volume pelvic ascites.  No suspicious abdominopelvic lymphadenopathy.  Status post hysterectomy.  Ovaries are unremarkable.  Bladder is within normal limits.  Degenerative changes of the visualized thoracolumbar spine. Benign- appearing sclerotic lesions in the right T10 vertebral body (series 2/image 8) and left posterior acetabulum (series 2/image 76). Additional sclerotic lesion in the right sacrum (series 2/image 58), nonspecific.  IMPRESSION:  Wall thickening/inflammatory changes extending from the distal transverse to the proximal sigmoid colon, compatible with infectious/inflammatory colitis.  No drainable fluid collection or abscess.  No free air.  Original Report Authenticated By: Charline Bills, M.D.     Medications: Scheduled:    . cefTRIAXone (ROCEPHIN)  IV  1 g Intravenous Q24H  . metronidazole  500 mg Intravenous Q8H  . pantoprazole (PROTONIX) IV  40 mg Intravenous Q12H  . sodium chloride  3 mL Intravenous Q12H  . venlafaxine XR  75 mg Oral Q3 days  . DISCONTD: pantoprazole  40 mg Oral Q1200   Continuous:    . sodium chloride 100 mL/hr at 07/29/12 0104     Assessment/Plan: Principal Problem:  *Colitis Active Problems:  Bloody diarrhea  Abdominal pain  GERD (gastroesophageal reflux disease)  L sided colitis/Lower GIB - presumed Ischemic colitis but on treatment for infectious colitis until ruled out. H/O Cecal ulcer in June 2010.  Unliklely to be IBD. Dr Kinnie Scales saw pt yesterday.  Continue Rocephin/Flagyl/Fluids and Sx Rx.  sips and chips - I advanced to clears but she is unsure whether she will be able to keep it down.C dif pending but based on fact that her diarrhea is better this is unlikely to be positive. I saw pt in room yesterday with Dr Kinnie Scales and he strongly suspects Ischemic colitis and agrees with current Rx plan. Colon/sigmoid on hold for now -  will need over the next week - two.  Will call him back if pt worsens .  Expect her to improve slowly.  Increase orals slowly.  ? UTI - On Abx - await Cx.   Follow Hbg - Down to 13.1 with IVF and a little GI Blood loss.. She is already Typed and screened. Recheck CBC tomorrow.  No further GI Bleeding.  Continue IVF for her underlying dehydration.   Leukocytosis -  24.7 to 15.7 - some better. Recheck tomorrow.  GERD - PPI.  S/P Nissen type procedure and with all vomiting more upper Sxs.  Protonix is now BID IV and I added Carafate  DM2 - Diet controlled as outpatient after sig weight loss.  Glucose yesterday am 121.  Hold CBGs and ssi unless necessary.  DVT Prophylaxis - SCDs  Benzos for sedation/Anxiety prn.  Continue Effexor - added back yesterday.  OK to D/C Tele.  OK to move to clears. Lower IVF to 75.  Shower??Marland Kitchen  Anticipate D/c Friday??    LOS: 2 days   Annabel Gibeau M 07/30/2012, 7:04 AM

## 2012-07-31 LAB — CBC
Hemoglobin: 11.4 g/dL — ABNORMAL LOW (ref 12.0–15.0)
MCHC: 33.1 g/dL (ref 30.0–36.0)
Platelets: 213 10*3/uL (ref 150–400)

## 2012-07-31 LAB — BASIC METABOLIC PANEL
GFR calc Af Amer: >90 mL/min (ref >90)
GFR calc non Af Amer: >90 mL/min (ref >90)
Glucose, Bld: 98 mg/dL (ref 70–99)
Potassium: 3.2 mEq/L — ABNORMAL LOW (ref 3.5–5.1)
Sodium: 141 mEq/L (ref 135–145)

## 2012-07-31 MED ORDER — CIPROFLOXACIN HCL 500 MG PO TABS
500.0000 mg | ORAL_TABLET | Freq: Two times a day (BID) | ORAL | Status: DC
  Filled 2012-07-31 (×3): qty 1

## 2012-07-31 MED ORDER — METRONIDAZOLE IN NACL 5-0.79 MG/ML-% IV SOLN
500.0000 mg | Freq: Three times a day (TID) | INTRAVENOUS | Status: AC
  Administered 2012-07-31 (×3): 500 mg via INTRAVENOUS
  Filled 2012-07-31 (×3): qty 100

## 2012-07-31 MED ORDER — PANTOPRAZOLE SODIUM 40 MG IV SOLR
40.0000 mg | Freq: Two times a day (BID) | INTRAVENOUS | Status: DC
  Administered 2012-07-31 (×2): 40 mg via INTRAVENOUS
  Filled 2012-07-31 (×4): qty 40

## 2012-07-31 MED ORDER — DEXTROSE 5 % IV SOLN
1.0000 g | INTRAVENOUS | Status: AC
  Administered 2012-07-31: 1 g via INTRAVENOUS
  Filled 2012-07-31: qty 10

## 2012-07-31 MED ORDER — CIPROFLOXACIN HCL 500 MG PO TABS
500.0000 mg | ORAL_TABLET | Freq: Two times a day (BID) | ORAL | Status: DC
  Administered 2012-08-01 – 2012-08-02 (×3): 500 mg via ORAL
  Filled 2012-07-31 (×5): qty 1

## 2012-07-31 MED ORDER — METRONIDAZOLE 500 MG PO TABS
500.0000 mg | ORAL_TABLET | Freq: Three times a day (TID) | ORAL | Status: DC
  Filled 2012-07-31 (×4): qty 1

## 2012-07-31 MED ORDER — LORAZEPAM 0.5 MG PO TABS
0.5000 mg | ORAL_TABLET | Freq: Two times a day (BID) | ORAL | Status: DC | PRN
  Administered 2012-07-31: 0.5 mg via ORAL
  Filled 2012-07-31: qty 1

## 2012-07-31 MED ORDER — POTASSIUM CHLORIDE CRYS ER 20 MEQ PO TBCR
40.0000 meq | EXTENDED_RELEASE_TABLET | Freq: Once | ORAL | Status: AC
  Administered 2012-07-31: 40 meq via ORAL
  Filled 2012-07-31: qty 2

## 2012-07-31 MED ORDER — METRONIDAZOLE 500 MG PO TABS
500.0000 mg | ORAL_TABLET | Freq: Three times a day (TID) | ORAL | Status: DC
  Administered 2012-08-01 – 2012-08-02 (×4): 500 mg via ORAL
  Filled 2012-07-31 (×7): qty 1

## 2012-07-31 MED ORDER — SUCRALFATE 1 GM/10ML PO SUSP
1.0000 g | Freq: Four times a day (QID) | ORAL | Status: DC
  Administered 2012-07-31 – 2012-08-02 (×7): 1 g via ORAL
  Filled 2012-07-31 (×12): qty 10

## 2012-07-31 NOTE — Progress Notes (Signed)
Subjective: Admitted for L sided colitis and Lower GIB presumed Ischemic colitis but on Rx for infectious until ruled out. On Rocephin/Flagyl/IVF C Dif (-) now off contact. Had more formed but more Bloody BMs yesterday.  Last BM more Brown.  Not too surprising.  Diet advanced to Clears yesterday.  Sxatic Rx on order.  E coli UTI - On Abx - pan sensitive   Less N/V - But Much more Dyspepsia - Carafate helping. Tenderness improving. Not hungry   Objective: Vital signs in last 24 hours: Temp:  [98.3 F (36.8 C)-98.5 F (36.9 C)] 98.5 F (36.9 C) (08/08 0520) Pulse Rate:  [59-68] 64  (08/08 0520) Resp:  [16-18] 18  (08/08 0520) BP: (112-133)/(70-75) 112/70 mmHg (08/08 0520) SpO2:  [92 %-100 %] 92 % (08/08 0520) Weight change:  Last BM Date: 07/30/12  CBG (last 3)  No results found for this basename: GLUCAP:3 in the last 72 hours  Intake/Output from previous day:  Intake/Output Summary (Last 24 hours) at 07/31/12 0659 Last data filed at 07/31/12 0600  Gross per 24 hour  Intake 3002.92 ml  Output    875 ml  Net 2127.92 ml   08/07 0701 - 08/08 0700 In: 3002.9 [P.O.:720; I.V.:1932.9; IV Piggyback:350] Out: 875 [Urine:875]   Physical Exam  General appearance: A and O sitting up. Eyes: no scleral icterus Throat: oropharynx moist without erythema Resp: Pulm Cardio: Reg GI: soft, Diffuse tender s R/G; bowel sounds diminished: no masses,  no organomegaly Extremities: no clubbing, cyanosis or edema   Lab Results:  [facility] 07/31/12 0456 07/29/12 0424  NA 141 136  K 3.2* 3.8  CL 106 103  CO2 26 25  GLUCOSE 98 121*  BUN 9 20  CREATININE 0.55 0.65  CALCIUM 7.8* 8.0*  MG -- --  PHOS -- --     Basename 07/28/12 1150  AST 13  ALT 7  ALKPHOS 100  BILITOT 0.8  PROT 6.6  ALBUMIN 3.2*     Basename 07/31/12 0456 07/29/12 0424  WBC 7.4 15.4*  NEUTROABS -- --  HGB 11.4* 13.1  HCT 34.4* 39.0  MCV 88.2 88.2  PLT 213 225    Lab Results  Component Value Date     INR 1.15 07/29/2012   INR 1.1 06/20/2009    No results found for this basename: CKTOTAL:3,CKMB:3,CKMBINDEX:3,TROPONINI:3 in the last 72 hours  No results found for this basename: TSH,T4TOTAL,FREET3,T3FREE,THYROIDAB in the last 72 hours  No results found for this basename: VITAMINB12:2,FOLATE:2,FERRITIN:2,TIBC:2,IRON:2,RETICCTPCT:2 in the last 72 hours  Micro Results: Recent Results (from the past 240 hour(s))  URINE CULTURE     Status: Normal   Collection Time   07/28/12  1:16 PM      Component Value Range Status Comment   Specimen Description URINE, CATHETERIZED   Final    Special Requests NONE   Final    Culture  Setup Time 07/28/2012 23:09   Final    Colony Count >=100,000 COLONIES/ML   Final    Culture ESCHERICHIA COLI   Final    Report Status 07/30/2012 FINAL   Final    Organism ID, Bacteria ESCHERICHIA COLI   Final   CLOSTRIDIUM DIFFICILE BY PCR     Status: Normal   Collection Time   07/29/12  2:36 PM      Component Value Range Status Comment   C difficile by pcr NEGATIVE  NEGATIVE Final      Studies/Results: No results found.   Medications: Scheduled:    . cefTRIAXone (  ROCEPHIN)  IV  1 g Intravenous Q24H  . metronidazole  500 mg Intravenous Q8H  . pantoprazole (PROTONIX) IV  40 mg Intravenous Q12H  . sodium chloride  3 mL Intravenous Q12H  . sucralfate  1 g Oral BID  . venlafaxine XR  75 mg Oral Q3 days   Continuous:    . sodium chloride 75 mL/hr at 07/31/12 0235     Assessment/Plan: Principal Problem:  *Colitis Active Problems:  Bloody diarrhea  Abdominal pain  GERD (gastroesophageal reflux disease)  L sided colitis/Lower GIB - presumed Ischemic colitis. Finishing empiric Rx for infectious colitis. C Dif until ruled out. H/O Cecal ulcer in June 2010.  Unliklely to be IBD. Dr Kinnie Scales saw pt.  Change Rocephin/Flagyl to PO Rx - but tomorrow.  Oral  - I advanced to clears.  Expect her to improve slowly.  Increase orals slowly. 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  E coli UTI - S/P 2-3 days Rocephin - switch to PO Cipro but tomorrow as she is still having sig GI Sxs.  Follow Hbg - Down to 11.4 with IVF and a little GI Blood loss.. She is already Typed and screened. Follow CBC tomorrow.    Continue IVF for her underlying dehydration but start weaning.  Leukocytosis -  24.7 to 15.7 - 7.4 = better.  GERD - PPI.  S/P Nissen type procedure and with all vomiting more upper Sxs.  Protonix BID IV and Carafate increase to 4 x per day  DM2 - Diet controlled as outpatient after sig weight loss.  Glucose 98.  Hold CBGs and ssi unless necessary.  DVT Prophylaxis - SCDs  Benzos for sedation/Anxiety prn.  Continue Effexor - added back yesterday.  Off Tele.  Clears. Lower IVF to 40.  Shower??Marland Kitchen  Anticipate D/c Friday??  Replete K    LOS: 3 days   Ronae Noell M 07/31/2012, 6:59 AM

## 2012-08-01 LAB — CBC
Hemoglobin: 12.8 g/dL (ref 12.0–15.0)
MCH: 29.6 pg (ref 26.0–34.0)
RBC: 4.32 MIL/uL (ref 3.87–5.11)
WBC: 7.2 10*3/uL (ref 4.0–10.5)

## 2012-08-01 LAB — BASIC METABOLIC PANEL
CO2: 28 mEq/L (ref 19–32)
Chloride: 104 mEq/L (ref 96–112)
Glucose, Bld: 119 mg/dL — ABNORMAL HIGH (ref 70–99)
Potassium: 3.1 mEq/L — ABNORMAL LOW (ref 3.5–5.1)
Sodium: 140 mEq/L (ref 135–145)

## 2012-08-01 MED ORDER — POTASSIUM CHLORIDE CRYS ER 10 MEQ PO TBCR
40.0000 meq | EXTENDED_RELEASE_TABLET | Freq: Every day | ORAL | Status: DC
  Administered 2012-08-01 – 2012-08-02 (×2): 40 meq via ORAL
  Filled 2012-08-01 (×2): qty 4

## 2012-08-01 MED ORDER — SACCHAROMYCES BOULARDII 250 MG PO CAPS
250.0000 mg | ORAL_CAPSULE | Freq: Two times a day (BID) | ORAL | Status: DC
  Administered 2012-08-01 – 2012-08-02 (×3): 250 mg via ORAL
  Filled 2012-08-01 (×4): qty 1

## 2012-08-01 MED ORDER — HYDROMORPHONE HCL PF 1 MG/ML IJ SOLN: 1.0000 mg | Freq: Four times a day (QID) | INTRAMUSCULAR | Status: DC | PRN

## 2012-08-01 MED ORDER — PANTOPRAZOLE SODIUM 40 MG PO TBEC
40.0000 mg | DELAYED_RELEASE_TABLET | Freq: Two times a day (BID) | ORAL | Status: DC
  Administered 2012-08-01 – 2012-08-02 (×3): 40 mg via ORAL
  Filled 2012-08-01 (×6): qty 1

## 2012-08-01 MED ORDER — POTASSIUM CHLORIDE 20 MEQ/15ML (10%) PO LIQD
40.0000 meq | Freq: Every day | ORAL | Status: DC
  Filled 2012-08-01: qty 30

## 2012-08-01 NOTE — Progress Notes (Signed)
Subjective: Soreness finally improving. Stools are dark brown and still loose. Weird dreams. Less Dyspepsia. She is wondering if Dilaudid causing Sxs. E coli UTI - On Abx - pan sensitive   Less N/V Carafate helping. Still not hungry   Objective: Vital signs in last 24 hours: Temp:  [98.3 F (36.8 C)-98.6 F (37 C)] 98.3 F (36.8 C) (08/09 0509) Pulse Rate:  [66-77] 77  (08/09 0509) Resp:  [16-18] 18  (08/09 0509) BP: (116-139)/(67-80) 131/80 mmHg (08/09 0509) SpO2:  [93 %-95 %] 93 % (08/09 0509) Weight:  [75.2 kg (165 lb 12.6 oz)] 75.2 kg (165 lb 12.6 oz) (08/09 0500) Weight change:  Last BM Date: 07/31/12  CBG (last 3)  No results found for this basename: GLUCAP:3 in the last 72 hours  Intake/Output from previous day:  Intake/Output Summary (Last 24 hours) at 08/01/12 0741 Last data filed at 07/31/12 1904  Gross per 24 hour  Intake 1702.33 ml  Output      0 ml  Net 1702.33 ml   08/08 0701 - 08/09 0700 In: 1702.3 [P.O.:860; I.V.:592.3; IV Piggyback:250] Out: -    Physical Exam  General appearance: A and O. ACTING Much better. Eyes: no scleral icterus Throat: oropharynx moist without erythema Resp: Pulm Cardio: Reg GI: soft, much less tender s R/G; bowel sounds fine: no masses,  no organomegaly Extremities: no clubbing, cyanosis or edema   Lab Results:  Basename 08/01/12 0430 07/31/12 0456  NA 140 141  K 3.1* 3.2*  CL 104 106  CO2 28 26  GLUCOSE 119* 98  BUN 5* 9  CREATININE 0.52 0.55  CALCIUM 8.0* 7.8*  MG -- --  PHOS -- --    No results found for this basename: AST:2,ALT:2,ALKPHOS:2,BILITOT:2,PROT:2,ALBUMIN:2 in the last 72 hours   Basename 08/01/12 0430 07/31/12 0456  WBC 7.2 7.4  NEUTROABS -- --  HGB 12.8 11.4*  HCT 37.3 34.4*  MCV 86.3 88.2  PLT 244 213    Lab Results  Component Value Date   INR 1.15 07/29/2012   INR 1.1 06/20/2009    No results found for this basename: CKTOTAL:3,CKMB:3,CKMBINDEX:3,TROPONINI:3 in the last 72  hours  No results found for this basename: TSH,T4TOTAL,FREET3,T3FREE,THYROIDAB in the last 72 hours  No results found for this basename: VITAMINB12:2,FOLATE:2,FERRITIN:2,TIBC:2,IRON:2,RETICCTPCT:2 in the last 72 hours  Micro Results: Recent Results (from the past 240 hour(s))  URINE CULTURE     Status: Normal   Collection Time   07/28/12  1:16 PM      Component Value Range Status Comment   Specimen Description URINE, CATHETERIZED   Final    Special Requests NONE   Final    Culture  Setup Time 07/28/2012 23:09   Final    Colony Count >=100,000 COLONIES/ML   Final    Culture ESCHERICHIA COLI   Final    Report Status 07/30/2012 FINAL   Final    Organism ID, Bacteria ESCHERICHIA COLI   Final   CLOSTRIDIUM DIFFICILE BY PCR     Status: Normal   Collection Time   07/29/12  2:36 PM      Component Value Range Status Comment   C difficile by pcr NEGATIVE  NEGATIVE Final      Studies/Results: No results found.   Medications: Scheduled:    . cefTRIAXone (ROCEPHIN)  IV  1 g Intravenous Q24H  . ciprofloxacin  500 mg Oral BID  . metronidazole  500 mg Intravenous Q8H  . metroNIDAZOLE  500 mg Oral Q8H  . pantoprazole (  PROTONIX) IV  40 mg Intravenous Q12H  . potassium chloride  40 mEq Oral Once  . sodium chloride  3 mL Intravenous Q12H  . sucralfate  1 g Oral Q6H  . venlafaxine XR  75 mg Oral Q3 days   Continuous:    . sodium chloride 40 mL/hr at 08/01/12 0350     Assessment/Plan: Principal Problem:  *Colitis Active Problems:  Bloody diarrhea  Abdominal pain  GERD (gastroesophageal reflux disease)  L sided colitis/Lower GIB - presumed Ischemic colitis. Finishing empiric Abx Rx for infectious colitis. C Dif until ruled out. H/O Cecal ulcer in June 2010.  Unliklely to be IBD. Dr Kinnie Scales saw pt on Tuesday and will do Colon/EGD in @ 1 week or so.  Yesterday was last day of Rocephin/Flagyl IV.  She will finish out Cipro/Flagyll PO.  Start [employer].  Oral  - I advanced to clears and now  Full.  Expect her to improve slowly.  Increase orals slowly. Will go home over weekend. 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  E coli UTI - S/P Rocephin - switch to PO Cipro.  Follow Hbg - 12.8.   D/C IVF.  Leukocytosis -  Improved  GERD - PPI.  S/P Nissen type procedure and with all vomiting more upper Sxs.  Protonix BID oral and Carafate 4 x per day is working.  She has some esophagitis and gastritis Sxs and stricture. Worsened by recent illness---Meds finally helping.  DM2 - Diet controlled as outpatient after sig weight loss.  Glucose 98-120 on sugary beverages.  Hold CBGs and ssi unless necessary.  DVT Prophylaxis - SCDs  Benzos for sedation/Anxiety prn.  Continue Effexor q 3 days as she is weaning.  Off Tele.  Clears. D/C IVF.  All IV to oral, Shower??.  Walk/Ambulate. Anticipate D/c Tomorrow??  Replete K  I will forward this note to Dr Kinnie Scales so he/staff will call her Monday to set up appt.    LOS: 4 days   Alison Mcguire M 08/01/2012, 7:41 AM

## 2012-08-02 LAB — BASIC METABOLIC PANEL
Calcium: 8.4 mg/dL (ref 8.4–10.5)
GFR calc non Af Amer: >90 mL/min (ref >90)
Glucose, Bld: 103 mg/dL — ABNORMAL HIGH (ref 70–99)
Sodium: 139 mEq/L (ref 135–145)

## 2012-08-02 LAB — CBC
MCH: 29.5 pg (ref 26.0–34.0)
MCHC: 33.8 g/dL (ref 30.0–36.0)
Platelets: 277 10*3/uL (ref 150–400)

## 2012-08-02 MED ORDER — ONDANSETRON 8 MG PO TBDP: 8.0000 mg | ORAL_TABLET | Freq: Three times a day (TID) | ORAL | Status: AC | PRN

## 2012-08-02 MED ORDER — METRONIDAZOLE 500 MG PO TABS: 500.0000 mg | ORAL_TABLET | Freq: Three times a day (TID) | ORAL | Status: AC

## 2012-08-02 MED ORDER — ALIGN PO CAPS: 1.0000 | ORAL_CAPSULE | Freq: Every day | ORAL | Status: AC

## 2012-08-02 MED ORDER — PANTOPRAZOLE SODIUM 40 MG PO TBEC: 40.0000 mg | DELAYED_RELEASE_TABLET | Freq: Two times a day (BID) | ORAL | Status: DC

## 2012-08-02 MED ORDER — CIPROFLOXACIN HCL 500 MG PO TABS: 500.0000 mg | ORAL_TABLET | Freq: Two times a day (BID) | ORAL | Status: AC

## 2012-08-02 MED ORDER — SUCRALFATE 1 GM/10ML PO SUSP: 1.0000 g | Freq: Four times a day (QID) | ORAL | Status: DC

## 2012-08-02 MED ORDER — BISMUTH SUBSALICYLATE 262 MG PO TABS: 2.0000 | ORAL_TABLET | Freq: Three times a day (TID) | ORAL | Status: DC | PRN

## 2012-08-02 NOTE — Discharge Summary (Addendum)
Physician Discharge Summary  Patient ID: Alison Mcguire MRN: [id number] DOB/AGE: 1941-09-21 71 y.o.  Admit date: 07/28/2012 Discharge date: 08/02/2012  Admission Diagnoses:  Discharge Diagnoses:  Principal Problem:  *Colitis Active Problems:  Bloody diarrhea  Abdominal pain  GERD (gastroesophageal reflux disease)   Discharged Condition: good  Hospital Course: Merinda presented with bloody diarrhea.  She had fever and nausea and vomiting.   She was admitted and she was felt to have ischemic colitis.  Her medicines that can lower bp were stopped. She was hydrated with IV fluids and she was given antiemetics.   She was treated with IV antibiotics which were transitioned to oral flagyl and cipro.  Her hemoglobin and vital signs remained stable and on 08/02/12 she was tolerated po intake and felt better.  She was still having some diarrhea stools but no more gross blood.  She was deemed stable for discharge with close GI follow-up.  Consults: GI  Significant Diagnostic Studies: ct abd/pelvis  CBC    Component Value Date/Time   WBC 7.1 08/02/2012 0420   RBC 4.41 08/02/2012 0420   HGB 13.0 08/02/2012 0420   HCT 38.5 08/02/2012 0420   PLT 277 08/02/2012 0420   MCV 87.3 08/02/2012 0420   MCH 29.5 08/02/2012 0420   MCHC 33.8 08/02/2012 0420   RDW 13.4 08/02/2012 0420   LYMPHSABS 1.5 06/20/2009 1418   MONOABS 0.7 06/20/2009 1418   EOSABS 0.1 06/20/2009 1418   BASOSABS 0.0 06/20/2009 1418    BMET    Component Value Date/Time   NA 139 08/02/2012 0420   K 3.8 08/02/2012 0420   CL 103 08/02/2012 0420   CO2 27 08/02/2012 0420   GLUCOSE 103* 08/02/2012 0420   BUN 4* 08/02/2012 0420   CREATININE 0.55 08/02/2012 0420   CALCIUM 8.4 08/02/2012 0420   GFRNONAA >90 08/02/2012 0420   GFRAA >90 08/02/2012 0420         Treatments: iv fluids and iv antibiotics  Discharge Exam:  Blood pressure 142/83, pulse 78, temperature 98.4 F (36.9 C), temperature source Oral, resp. rate 16, height 5\' 4"   (1.626 m), weight 74.1 kg (163 lb 5.8 oz), last menstrual period 07/28/2012, SpO2 94.00%.  Physical Exam:Alert and oriented.  NAD.   Sitting on side of bed.   No JVD, no pallor or icterus.  Lungs CTA bilaterally no wheezes, no rales,  No rhonchi.   Heart is RRR no m/r/G.  Abd with slightly hyperactive bowel sounds,  Slight LLQ tenderness.   No C/C/E.     Disposition:discharge to home   Medication List  As of 08/02/2012  9:12 AM   ASK your doctor about these medications         LORazepam 0.5 MG tablet   Commonly known as: ATIVAN   Take 0.5-1 mg by mouth at bedtime.      minoxidil 2.5 MG tablet----Discontinue minoxidil   Commonly known as: LONITEN   Take 2.5 mg by mouth daily.      multivitamin with minerals Tabs-wait to resume the multivitamin until better.   Take 1 tablet by mouth daily.      omega-3 acid ethyl esters 1 G capsule-Stop the fish oil until advised by dr. Eloise Harman.   Commonly known as: LOVAZA   Take 2 g by mouth 2 (two) times daily.      omeprazole 20 MG capsule-stop this and replace it with the protonix.   Commonly known as: PRILOSEC   Take 20 mg by mouth daily.  oxybutynin 5 MG 24 hr tablet- continue this.   Commonly known as: DITROPAN-XL   Take 5 mg by mouth 2 (two) times daily.      propranolol 10 MG tablet-Stop the propranolol until further notice.   Commonly known as: INDERAL   Take 10 mg by mouth daily.      venlafaxine XR 75 MG 24 hr capsule-resume it.   Commonly known as: EFFEXOR-XR   Take 75 mg by mouth every other day.      Vitamin D (Ergocalciferol) 50000 UNITS Caps-resume this   Commonly known as: DRISDOL   Take 50,000 Units by mouth every 7 (seven) days. Every Wednesday  Flagyl  500 mg by mouth three times a day for 10 more days  Cipro 500 mg by mouth twice a day for 7 days  Get probiotic otc. Align  And take one daily for 3 weeks at lunchtime.  OTc Kaopectate is okay.  Two tabs after each loose BM, no more than 6 tabs in a 24 hr  period.  zofran odt  1/2 to one of an 8 mg pill dissolved in mouth up to every 8 hours as needed.  Sucralfate susp.  10 ML 4 times a day.  Protonix 40 mg one by mouth twice a day (in place of omeprazole).   Keep hydrated, eat bland foods until better    Follow-up with dr. Kinnie Scales this week. Keep routine visit with Dr. Eloise Harman.             SignedRodrigo Ran A 08/02/2012, 9:12 AM

## 2014-03-21 ENCOUNTER — Other Ambulatory Visit: Payer: Self-pay

## 2014-03-21 ENCOUNTER — Emergency Department (HOSPITAL_COMMUNITY): Payer: Medicare HMO

## 2014-03-21 ENCOUNTER — Ambulatory Visit (INDEPENDENT_AMBULATORY_CARE_PROVIDER_SITE_OTHER): Payer: Commercial Managed Care - HMO | Admitting: Emergency Medicine

## 2014-03-21 ENCOUNTER — Emergency Department (HOSPITAL_COMMUNITY)
Admission: EM | Admit: 2014-03-21 | Discharge: 2014-03-21 | Disposition: A | Discharge status: Home / Self Care | Payer: Medicare HMO | Attending: Emergency Medicine | Admitting: Emergency Medicine

## 2014-03-21 ENCOUNTER — Encounter (HOSPITAL_COMMUNITY): Payer: Self-pay | Admitting: Emergency Medicine

## 2014-03-21 VITALS — BP 138/84 | HR 87 | Temp 98.2°F | Resp 18 | Ht 62.0 in | Wt 148.6 lb

## 2014-03-21 DIAGNOSIS — R42 Dizziness and giddiness: Secondary | ICD-10-CM

## 2014-03-21 DIAGNOSIS — K219 Gastro-esophageal reflux disease without esophagitis: Secondary | ICD-10-CM | POA: Insufficient documentation

## 2014-03-21 DIAGNOSIS — I10 Essential (primary) hypertension: Secondary | ICD-10-CM

## 2014-03-21 DIAGNOSIS — R232 Flushing: Secondary | ICD-10-CM

## 2014-03-21 DIAGNOSIS — F329 Major depressive disorder, single episode, unspecified: Secondary | ICD-10-CM | POA: Insufficient documentation

## 2014-03-21 DIAGNOSIS — E785 Hyperlipidemia, unspecified: Secondary | ICD-10-CM | POA: Insufficient documentation

## 2014-03-21 DIAGNOSIS — R51 Headache: Secondary | ICD-10-CM

## 2014-03-21 DIAGNOSIS — Z79899 Other long term (current) drug therapy: Secondary | ICD-10-CM | POA: Insufficient documentation

## 2014-03-21 DIAGNOSIS — F3289 Other specified depressive episodes: Secondary | ICD-10-CM | POA: Insufficient documentation

## 2014-03-21 DIAGNOSIS — F411 Generalized anxiety disorder: Secondary | ICD-10-CM | POA: Insufficient documentation

## 2014-03-21 LAB — POCT CBC
Granulocyte percent: 62.4 %G (ref 37–80)
HEMATOCRIT: 44.4 % (ref 37.7–47.9)
HEMOGLOBIN: 14 g/dL (ref 12.2–16.2)
LYMPH, POC: 1.9 (ref 0.6–3.4)
MCH, POC: 29.3 pg (ref 27–31.2)
MCHC: 31.5 g/dL — AB (ref 31.8–35.4)
MCV: 92.9 fL (ref 80–97)
MID (cbc): 0.4 (ref 0–0.9)
MPV: 9 fL (ref 0–99.8)
POC GRANULOCYTE: 3.8 (ref 2–6.9)
POC LYMPH PERCENT: 31.1 %L (ref 10–50)
POC MID %: 6.5 %M (ref 0–12)
Platelet Count, POC: 262 10*3/uL (ref 142–424)
RBC: 4.78 M/uL (ref 4.04–5.48)
RDW, POC: 14.3 %
WBC: 6.1 10*3/uL (ref 4.6–10.2)

## 2014-03-21 LAB — GLUCOSE, POCT (MANUAL RESULT ENTRY): POC GLUCOSE: 110 mg/dL — AB (ref 70–99)

## 2014-03-21 MED ORDER — MECLIZINE HCL 25 MG PO TABS
25.0000 mg | ORAL_TABLET | Freq: Once | ORAL | Status: AC
  Administered 2014-03-21: 25 mg via ORAL
  Filled 2014-03-21: qty 1

## 2014-03-21 MED ORDER — ONDANSETRON HCL 4 MG/2ML IJ SOLN
4.0000 mg | Freq: Once | INTRAMUSCULAR | Status: AC
  Administered 2014-03-21: 4 mg via INTRAVENOUS
  Filled 2014-03-21: qty 2

## 2014-03-21 MED ORDER — MECLIZINE HCL 25 MG PO TABS: 25.0000 mg | ORAL_TABLET | Freq: Three times a day (TID) | ORAL | Status: DC | PRN

## 2014-03-21 MED ORDER — SODIUM CHLORIDE 0.9 % IV BOLUS (SEPSIS)
1000.0000 mL | Freq: Once | INTRAVENOUS | Status: AC
  Administered 2014-03-21: 1000 mL via INTRAVENOUS

## 2014-03-21 NOTE — Progress Notes (Addendum)
Subjective:  This chart was scribed for Alison Queen, MD by Alison Mcguire, Medical Scribe. This patient was seen in Room 13 and the patient's care was started at 3:44 PM.   Patient ID: Alison Mcguire, female    DOB: 1941/06/04, 73 y.o.   MRN: [id number]  HPI HPI Comments: Alison Mcguire is a 73 y.o. female who presents to the [facility] complaining of dizziness that started at 5 AM this morning while the patient was turning her head while laying down.  She states that when she got up this morning she was still experiencing dizziness and had to brace herself on the walls of her house to get her balance.  The patient states that her symptoms stopped as she was on the way to church this morning but returned while eating lunch at [facility].  She states that she went to the [employer] to check her blood pressure and noticed that her numbers were high.  She denies trouble with speech, facial asymmetry, and numbness or weakness in her arms or legs as associated symptoms.  She states that she suspects that there is a problem with her inner ear.  She states that she takes Minoxidil for her hair and the Statin due to her family history of hypertension.  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  The patient's PCP is Dr. Sharlett Iles at [facility].  The patient states that she had two siblings die from TIA due to hypertension.    Past Medical History  Diagnosis Date  . GI bleed   . Acid reflux   . Anxiety   . Depression   . Hyperlipidemia    Past Surgical History  Procedure Laterality Date  . Abdominal hysterectomy    . Breast surgery     Family History  Problem Relation Age of Onset  . Hypertension Sister   . Diabetes Son   . Stroke Sister   . Diabetes Sister    History   Social History  . Marital Status: Single    Spouse Name: N/A    Number of Children: N/A  . Years  of Education: N/A   Occupational History  . Not on file.   Social History Main Topics  . Smoking status: Never Smoker   . Smokeless tobacco: Not on file  . Alcohol Use: No  . Drug Use: No  . Sexual Activity:    Other Topics Concern  . Not on file   Social History Narrative  . No narrative on file   Allergies  Allergen Reactions  . Aspirin   . Codeine   . Ether   . Soma [Carisoprodol]     Review of Systems  Neurological: Positive for dizziness. Negative for facial asymmetry, speech difficulty, weakness, numbness and headaches.  All other systems reviewed and are negative.     Objective:  Physical Exam Physical Exam  Nursing note and vitals reviewed. Constitutional: She is oriented to person, place, and time. She appears well-developed and well-nourished.  HENT:  Head: Normocephalic and atraumatic.  Eyes: EOM are normal.  Neck: Normal range of motion.  Cardiovascular: Normal rate.   Pulmonary/Chest: Effort normal.  Musculoskeletal: Normal range of motion.  Neurological: She is alert and oriented to person, place, and time. Cranial nerves II through XII are intact. Motor strength is 5 out of 5 almost groups. Deep tendon reflexes are 2+ and  symmetrical.  Skin: Skin is warm and dry.  Psychiatric: She has a normal mood and affect. Her behavior is normal.    BP 138/84  Pulse 87  Temp(Src) 98.2 F (36.8 C) (Oral)  Resp 18  Ht 5\' 2"  (3.212 m)  Wt 148 lb 9.6 oz (67.405 kg)  BMI 27.17 kg/m2  SpO2 97%  LMP 07/28/2012 Assessment & Plan:   Initially I planned to do a CT of the head as an outpatient. I went back and the patient was complaining of a decreased sensation as well as a right temporal headache. She does have a history of migraines. Her repeat blood pressure was 144/92. She has no focal neurological signs at the present time. She will be sent to [facility] for evaluation   I personally performed the services described in this documentation, which was scribed in my  presence. The recorded information has been reviewed and is accurate.

## 2014-03-21 NOTE — ED Notes (Signed)
Pt. Ambulated to restroom with steady gait. States "On my way back I got slightly dizzy but it's not even something I would mention if it hadn't been worse earlier".

## 2014-03-21 NOTE — ED Notes (Signed)
PT. Refused blood work at this time. MD made aware

## 2014-03-21 NOTE — ED Notes (Signed)
Pt. States at 0500 this AM had dizziness with unsteady gait and blurred vision. States dizziness subsided but had a "small pain/funny feeling" in right temporal region. Went to fire station and urgent care, BP elevated to 180/120. Pt. Denies complaints at this time. Hx of stroke in family but no personal hx. Stroke screen negative. Alert and oriented x4.

## 2014-03-21 NOTE — ED Notes (Signed)
Pt. States nausea with dizziness. States "something is just not right", pt. Points to right side of head/face.

## 2014-03-21 NOTE — ED Provider Notes (Signed)
CSN: [id number]     Arrival date & time 03/21/14  1715 History   First MD Initiated Contact with Patient 03/21/14 1720     Chief Complaint  Patient presents with  . Dizziness     (Consider location/radiation/quality/duration/timing/severity/associated sxs/prior Treatment) HPI Comments: Patient is a 73 year old female with history of acid reflux and anxiety. She presents today with complaints of dizziness. She states when she woke up this morning she felt off balance and had to brace herself gets the wall to keep her balance. This seemed to come and go throughout the morning. She was able to get to church however her symptoms worsened afterward. She went to the fire department and had her blood pressure checked and was found to be significantly elevated. They told her she should be evaluated by a physician and was taken to urgent care. While in urgent care her blood pressure remained elevated and was sent here for further evaluation. She is feeling somewhat better now.  Patient is a 73 y.o. female presenting with dizziness. The history is provided by the patient.  Dizziness Quality:  Imbalance Severity:  Moderate Onset quality:  Sudden Duration:  10 hours Timing:  Intermittent Chronicity:  New Relieved by:  Nothing Worsened by:  Movement Ineffective treatments:  None tried   Past Medical History  Diagnosis Date  . GI bleed   . Acid reflux   . Anxiety   . Depression   . Hyperlipidemia    Past Surgical History  Procedure Laterality Date  . Abdominal hysterectomy    . Breast surgery     Family History  Problem Relation Age of Onset  . Hypertension Sister   . Diabetes Son   . Stroke Sister   . Diabetes Sister    History  Substance Use Topics  . Smoking status: Never Smoker   . Smokeless tobacco: Not on file  . Alcohol Use: No   OB History   Grav Para Term Preterm Abortions TAB SAB Ect Mult Living                 Review of Systems  Neurological: Positive for  dizziness.  All other systems reviewed and are negative.      Allergies  Aspirin; Codeine; Ether; and Soma  Home Medications   Current Outpatient Rx  Name  Route  Sig  Dispense  Refill  . Bismuth Subsalicylate 409 MG TABS   Oral   Take 2 tablets (524 mg total) by mouth 3 (three) times daily as needed (may use two tabs after each loose bm, do not exceed 6 tabs in 24 hr period.).   80 each   0   . LORazepam (ATIVAN) 0.5 MG tablet   Oral   Take 0.5-1 mg by mouth at bedtime.         . minoxidil (LONITEN) 2.5 MG tablet   Oral   Take by mouth daily.         Marland Kitchen oxybutynin (DITROPAN-XL) 5 MG 24 hr tablet   Oral   Take 5 mg by mouth 2 (two) times daily.         Marland Kitchen EXPIRED: pantoprazole (PROTONIX) 40 MG tablet   Oral   Take 1 tablet (40 mg total) by mouth 2 (two) times daily.   60 tablet   6   . pravastatin (PRAVACHOL) 20 MG tablet   Oral   Take 20 mg by mouth daily.         Marland Kitchen EXPIRED: sucralfate (CARAFATE) 1  GM/10ML suspension   Oral   Take 10 mLs (1 g total) by mouth every 6 (six) hours.   420 mL   1   . venlafaxine XR (EFFEXOR-XR) 75 MG 24 hr capsule   Oral   Take 75 mg by mouth every other day.         . Vitamin D, Ergocalciferol, (DRISDOL) 50000 UNITS CAPS   Oral   Take 50,000 Units by mouth every 7 (seven) days. Every Wednesday          BP 121/79  Temp(Src) 98.4 F (36.9 C) (Oral)  Resp 11  SpO2 99%  LMP 07/28/2012 Physical Exam  Nursing note and vitals reviewed. Constitutional: She is oriented to person, place, and time. She appears well-developed and well-nourished. No distress.  HENT:  Head: Normocephalic and atraumatic.  Mouth/Throat: Oropharynx is clear and moist.  TMs are clear bilaterally.  Eyes: EOM are normal. Pupils are equal, round, and reactive to light.  Neck: Normal range of motion. Neck supple.  Cardiovascular: Normal rate and regular rhythm.  Exam reveals no gallop and no friction rub.   No murmur heard. Pulmonary/Chest:  Effort normal and breath sounds normal. No respiratory distress. She has no wheezes.  Abdominal: Soft. Bowel sounds are normal. She exhibits no distension. There is no tenderness.  Musculoskeletal: Normal range of motion.  Neurological: She is alert and oriented to person, place, and time. No cranial nerve deficit. She exhibits normal muscle tone. Coordination normal.  Skin: Skin is warm and dry. She is not diaphoretic.    ED Course  Procedures (including critical care time) Labs Review Labs Reviewed  CBC WITH DIFFERENTIAL  COMPREHENSIVE METABOLIC PANEL   Imaging Review No results found.   Date: 03/21/2014  Rate: 79  Rhythm: normal sinus rhythm  QRS Axis: normal  Intervals: normal  ST/T Wave abnormalities: normal  Conduction Disutrbances:none  Narrative Interpretation:   Old EKG Reviewed: none available    MDM   Final diagnoses:  None    Patient is a 73 year old female who presents with complaints of dizziness and disequilibrium that started this morning shortly after waking from sleep. Her neurologic exam is nonfocal and workup reveals normal electrolytes and negative head CT. She is feeling better with fluids and meclizine. I suspect her symptoms are related to peripheral vertigo. She will be discharged with meclizine and followup with her primary Dr. She understands to return if her symptoms substantially worsen or change.    Veryl Speak, MD 03/21/14 2014

## 2014-03-21 NOTE — ED Notes (Signed)
Per EMS, pt from [facility]. Woke up with intermittent dizziness and blurred vision. Went to fire station, BP 180/120, no hx of HTN. Per EMS, stroke screen negative.

## 2014-03-21 NOTE — Discharge Instructions (Signed)
Meclizine as prescribed as needed for dizziness.  Return to the emergency department if your symptoms substantially worsens or you develop any new or concerning symptoms.  Be sure to followup with your primary Dr. if not improving in the next week.   Benign Positional Vertigo Vertigo means you feel like you or your surroundings are moving when they are not. Benign positional vertigo is the most common form of vertigo. Benign means that the cause of your condition is not serious. Benign positional vertigo is more common in older adults. CAUSES  Benign positional vertigo is the result of an upset in the labyrinth system. This is an area in the middle ear that helps control your balance. This may be caused by a viral infection, head injury, or repetitive motion. However, often no specific cause is found. SYMPTOMS  Symptoms of benign positional vertigo occur when you move your head or eyes in different directions. Some of the symptoms may include:  Loss of balance and falls.  Vomiting.  Blurred vision.  Dizziness.  Nausea.  Involuntary eye movements (nystagmus). DIAGNOSIS  Benign positional vertigo is usually diagnosed by physical exam. If the specific cause of your benign positional vertigo is unknown, your caregiver may perform imaging tests, such as magnetic resonance imaging (MRI) or computed tomography (CT). TREATMENT  Your caregiver may recommend movements or procedures to correct the benign positional vertigo. Medicines such as meclizine, benzodiazepines, and medicines for nausea may be used to treat your symptoms. In rare cases, if your symptoms are caused by certain conditions that affect the inner ear, you may need surgery. HOME CARE INSTRUCTIONS   Follow your caregiver's instructions.  Move slowly. Do not make sudden body or head movements.  Avoid driving.  Avoid operating heavy machinery.  Avoid performing any tasks that would be dangerous to you or others during a  vertigo episode.  Drink enough fluids to keep your urine clear or pale yellow. SEEK IMMEDIATE MEDICAL CARE IF:   You develop problems with walking, weakness, numbness, or using your arms, hands, or legs.  You have difficulty speaking.  You develop severe headaches.  Your nausea or vomiting continues or gets worse.  You develop visual changes.  Your family or friends notice any behavioral changes.  Your condition gets worse.  You have a fever.  You develop a stiff neck or sensitivity to light. MAKE SURE YOU:   Understand these instructions.  Will watch your condition.  Will get help right away if you are not doing well or get worse. Document Released: 09/17/2006 Document Revised: 03/03/2012 Document Reviewed: 08/30/2011 [facility] Patient Information 2014 [place].

## 2014-03-22 LAB — COMPREHENSIVE METABOLIC PANEL
ALBUMIN: 4.5 g/dL (ref 3.5–5.2)
ALT: 17 U/L (ref 0–35)
AST: 20 U/L (ref 0–37)
Alkaline Phosphatase: 58 U/L (ref 39–117)
BUN: 15 mg/dL (ref 6–23)
CALCIUM: 9.5 mg/dL (ref 8.4–10.5)
CHLORIDE: 105 meq/L (ref 96–112)
CO2: 31 mEq/L (ref 19–32)
Creat: 0.86 mg/dL (ref 0.50–1.10)
Glucose, Bld: 110 mg/dL — ABNORMAL HIGH (ref 70–99)
POTASSIUM: 4.8 meq/L (ref 3.5–5.3)
Sodium: 143 mEq/L (ref 135–145)
TOTAL PROTEIN: 7.1 g/dL (ref 6.0–8.3)
Total Bilirubin: 0.5 mg/dL (ref 0.2–1.2)

## 2014-03-22 LAB — TSH: TSH: 2.618 u[IU]/mL (ref 0.350–4.500)

## 2014-03-22 LAB — T4, FREE: Free T4: 0.96 ng/dL (ref 0.80–1.80)

## 2015-01-04 DIAGNOSIS — L650 Telogen effluvium: Secondary | ICD-10-CM | POA: Diagnosis not present

## 2015-01-04 DIAGNOSIS — L821 Other seborrheic keratosis: Secondary | ICD-10-CM | POA: Diagnosis not present

## 2015-01-04 DIAGNOSIS — L601 Onycholysis: Secondary | ICD-10-CM | POA: Diagnosis not present

## 2015-01-04 DIAGNOSIS — L570 Actinic keratosis: Secondary | ICD-10-CM | POA: Diagnosis not present

## 2015-04-07 ENCOUNTER — Emergency Department (HOSPITAL_COMMUNITY)
Admission: EM | Admit: 2015-04-07 | Discharge: 2015-04-07 | Disposition: A | Discharge status: Home / Self Care | Payer: Commercial Managed Care - HMO | Attending: Emergency Medicine | Admitting: Emergency Medicine

## 2015-04-07 ENCOUNTER — Encounter (HOSPITAL_COMMUNITY): Payer: Self-pay

## 2015-04-07 ENCOUNTER — Emergency Department (HOSPITAL_COMMUNITY): Payer: Commercial Managed Care - HMO

## 2015-04-07 DIAGNOSIS — E785 Hyperlipidemia, unspecified: Secondary | ICD-10-CM | POA: Insufficient documentation

## 2015-04-07 DIAGNOSIS — K219 Gastro-esophageal reflux disease without esophagitis: Secondary | ICD-10-CM | POA: Diagnosis not present

## 2015-04-07 DIAGNOSIS — R1110 Vomiting, unspecified: Secondary | ICD-10-CM | POA: Diagnosis not present

## 2015-04-07 DIAGNOSIS — Z79899 Other long term (current) drug therapy: Secondary | ICD-10-CM | POA: Insufficient documentation

## 2015-04-07 DIAGNOSIS — R110 Nausea: Secondary | ICD-10-CM | POA: Insufficient documentation

## 2015-04-07 DIAGNOSIS — Z8659 Personal history of other mental and behavioral disorders: Secondary | ICD-10-CM | POA: Diagnosis not present

## 2015-04-07 DIAGNOSIS — R42 Dizziness and giddiness: Secondary | ICD-10-CM | POA: Diagnosis not present

## 2015-04-07 DIAGNOSIS — R51 Headache: Secondary | ICD-10-CM | POA: Insufficient documentation

## 2015-04-07 LAB — CBC WITH DIFFERENTIAL/PLATELET
BASOS ABS: 0 10*3/uL (ref 0.0–0.1)
Basophils Relative: 0 % (ref 0–1)
EOS PCT: 1 % (ref 0–5)
Eosinophils Absolute: 0.1 10*3/uL (ref 0.0–0.7)
HEMATOCRIT: 44.2 % (ref 36.0–46.0)
Hemoglobin: 14.5 g/dL (ref 12.0–15.0)
LYMPHS ABS: 1.5 10*3/uL (ref 0.7–4.0)
LYMPHS PCT: 31 % (ref 12–46)
MCH: 29.4 pg (ref 26.0–34.0)
MCHC: 32.8 g/dL (ref 30.0–36.0)
MCV: 89.7 fL (ref 78.0–100.0)
Monocytes Absolute: 0.3 10*3/uL (ref 0.1–1.0)
Monocytes Relative: 6 % (ref 3–12)
NEUTROS ABS: 2.9 10*3/uL (ref 1.7–7.7)
NEUTROS PCT: 62 % (ref 43–77)
Platelets: 241 10*3/uL (ref 150–400)
RBC: 4.93 MIL/uL (ref 3.87–5.11)
RDW: 13.5 % (ref 11.5–15.5)
WBC: 4.7 10*3/uL (ref 4.0–10.5)

## 2015-04-07 LAB — BASIC METABOLIC PANEL
Anion gap: 8 (ref 5–15)
BUN: 20 mg/dL (ref 6–23)
CHLORIDE: 106 mmol/L (ref 96–112)
CO2: 30 mmol/L (ref 19–32)
Calcium: 9.8 mg/dL (ref 8.4–10.5)
Creatinine, Ser: 0.64 mg/dL (ref 0.50–1.10)
GFR calc Af Amer: >90 mL/min (ref >90)
GFR calc non Af Amer: 86 mL/min — ABNORMAL LOW (ref >90)
Glucose, Bld: 118 mg/dL — ABNORMAL HIGH (ref 70–99)
Potassium: 4.3 mmol/L (ref 3.5–5.1)
Sodium: 144 mmol/L (ref 135–145)

## 2015-04-07 MED ORDER — ONDANSETRON 4 MG PO TBDP
4.0000 mg | ORAL_TABLET | Freq: Once | ORAL | Status: AC
  Administered 2015-04-07: 4 mg via ORAL
  Filled 2015-04-07: qty 1

## 2015-04-07 MED ORDER — PROMETHAZINE HCL 25 MG/ML IJ SOLN
12.5000 mg | Freq: Once | INTRAMUSCULAR | Status: AC
  Administered 2015-04-07: 12.5 mg via INTRAMUSCULAR
  Filled 2015-04-07: qty 1

## 2015-04-07 MED ORDER — MECLIZINE HCL 25 MG PO TABS
25.0000 mg | ORAL_TABLET | Freq: Once | ORAL | Status: AC
Start: 2015-04-07 — End: 2015-04-07
  Administered 2015-04-07: 25 mg via ORAL
  Filled 2015-04-07: qty 1

## 2015-04-07 MED ORDER — ONDANSETRON 4 MG PO TBDP: 4.0000 mg | ORAL_TABLET | Freq: Three times a day (TID) | ORAL | Status: DC | PRN

## 2015-04-07 MED ORDER — MECLIZINE HCL 25 MG PO TABS: ORAL_TABLET | ORAL | Status: DC

## 2015-04-07 MED ORDER — MECLIZINE HCL 50 MG PO TABS: 50.0000 mg | ORAL_TABLET | Freq: Three times a day (TID) | ORAL | Status: DC | PRN

## 2015-04-07 NOTE — Discharge Instructions (Signed)
Take Antivert/meclizine until 24 hours without dizziness. Recheck with Dr. Constance Holster, ENT.  Benign Positional Vertigo Vertigo means you feel like you or your surroundings are moving when they are not. Benign positional vertigo is the most common form of vertigo. Benign means that the cause of your condition is not serious. Benign positional vertigo is more common in older adults. CAUSES  Benign positional vertigo is the result of an upset in the labyrinth system. This is an area in the middle ear that helps control your balance. This may be caused by a viral infection, head injury, or repetitive motion. However, often no specific cause is found. SYMPTOMS  Symptoms of benign positional vertigo occur when you move your head or eyes in different directions. Some of the symptoms may include:  Loss of balance and falls.  Vomiting.  Blurred vision.  Dizziness.  Nausea.  Involuntary eye movements (nystagmus). DIAGNOSIS  Benign positional vertigo is usually diagnosed by physical exam. If the specific cause of your benign positional vertigo is unknown, your caregiver may perform imaging tests, such as magnetic resonance imaging (MRI) or computed tomography (CT). TREATMENT  Your caregiver may recommend movements or procedures to correct the benign positional vertigo. Medicines such as meclizine, benzodiazepines, and medicines for nausea may be used to treat your symptoms. In rare cases, if your symptoms are caused by certain conditions that affect the inner ear, you may need surgery. HOME CARE INSTRUCTIONS   Follow your caregiver's instructions.  Move slowly. Do not make sudden body or head movements.  Avoid driving.  Avoid operating heavy machinery.  Avoid performing any tasks that would be dangerous to you or others during a vertigo episode.  Drink enough fluids to keep your urine clear or pale yellow. SEEK IMMEDIATE MEDICAL CARE IF:   You develop problems with walking, weakness,  numbness, or using your arms, hands, or legs.  You have difficulty speaking.  You develop severe headaches.  Your nausea or vomiting continues or gets worse.  You develop visual changes.  Your family or friends notice any behavioral changes.  Your condition gets worse.  You have a fever.  You develop a stiff neck or sensitivity to light. MAKE SURE YOU:   Understand these instructions.  Will watch your condition.  Will get help right away if you are not doing well or get worse. Document Released: 09/17/2006 Document Revised: 03/03/2012 Document Reviewed: 08/30/2011 [facility] Patient Information 2015 [place], [place]. This information is not intended to replace advice given to you by your health care provider. Make sure you discuss any questions you have with your health care provider.

## 2015-04-07 NOTE — ED Provider Notes (Signed)
CSN: [id number]     Arrival date & time 04/07/15  0827 History   First MD Initiated Contact with Patient 04/07/15 [phone]     Chief Complaint  Patient presents with  . Dizziness  . Emesis     HPI   Patient presents with dizziness. Symptoms intermittently over the last 7 weeks. Has had 4 distinct episodes lasting 3-4 days at a time. Feeling of movement like spinning vertigo around her. Associated nausea. Intermittent mild headache. No numbness weakness tingling or additional stroke symptoms. No falls injuries or trauma to the head or neck. No new medications. No other symptoms. Past Medical History  Diagnosis Date  . GI bleed   . Acid reflux   . Anxiety   . Depression   . Hyperlipidemia    Past Surgical History  Procedure Laterality Date  . Abdominal hysterectomy    . Breast surgery     Family History  Problem Relation Age of Onset  . Hypertension Sister   . Diabetes Son   . Stroke Sister   . Diabetes Sister    History  Substance Use Topics  . Smoking status: Never Smoker   . Smokeless tobacco: Not on file  . Alcohol Use: No   OB History    No data available     Review of Systems  Constitutional: Negative for fever, chills, diaphoresis, appetite change and fatigue.  HENT: Negative for mouth sores, sore throat and trouble swallowing.   Eyes: Negative for visual disturbance.  Respiratory: Negative for cough, chest tightness, shortness of breath and wheezing.   Cardiovascular: Negative for chest pain.  Gastrointestinal: Positive for nausea. Negative for vomiting, abdominal pain, diarrhea and abdominal distention.  Endocrine: Negative for polydipsia, polyphagia and polyuria.  Genitourinary: Negative for dysuria, frequency and hematuria.  Musculoskeletal: Negative for gait problem.  Skin: Negative for color change, pallor and rash.  Neurological: Positive for dizziness. Negative for syncope, light-headedness and headaches.  Hematological: Does not bruise/bleed easily.   Psychiatric/Behavioral: Negative for behavioral problems and confusion.      Allergies  Codeine; Aspirin; Clinoril; Demerol; Dilaudid; Ether; Soma; Betadine; and Nickel  Home Medications   Prior to Admission medications   Medication Sig Start Date End Date Taking? Authorizing Provider  calcium-vitamin D 250-100 MG-UNIT per tablet Take 1 tablet by mouth 2 (two) times daily.   Yes Historical Provider, MD  cetirizine (ZYRTEC) 10 MG tablet Take 10 mg by mouth daily.   Yes Historical Provider, MD  GLUCOS-CHONDROIT-HYALURON-MSM PO Take 1 tablet by mouth daily.   Yes Historical Provider, MD  minoxidil (LONITEN) 2.5 MG tablet Take 2.5 mg by mouth daily.   Yes Historical Provider, MD  Multiple Vitamins-Minerals (HAIR/SKIN/NAILS PO) Take 1 tablet by mouth 2 (two) times daily.   Yes Historical Provider, MD  Multiple Vitamins-Minerals (MULTIVITAMIN WITH MINERALS) tablet Take 1 tablet by mouth daily.   Yes Historical Provider, MD  omeprazole (PRILOSEC) 20 MG capsule Take 20 mg by mouth daily.   Yes Historical Provider, MD  pravastatin (PRAVACHOL) 20 MG tablet Take 20 mg by mouth daily.   Yes Historical Provider, MD  Vitamin D, Ergocalciferol, (DRISDOL) 50000 UNITS CAPS Take 50,000 Units by mouth every 7 (seven) days. Every Wednesday   Yes Historical Provider, MD  meclizine (ANTIVERT) 25 MG tablet Take until 24 hours without dizziness 04/07/15   Tanna Furry, MD  ondansetron (ZOFRAN ODT) 4 MG disintegrating tablet Take 1 tablet (4 mg total) by mouth every 8 (eight) hours as needed for nausea. 04/07/15  Tanna Furry, MD  ondansetron (ZOFRAN ODT) 4 MG disintegrating tablet Take 1 tablet (4 mg total) by mouth every 8 (eight) hours as needed for nausea. 04/07/15   Tanna Furry, MD  pantoprazole (PROTONIX) 40 MG tablet Take 1 tablet (40 mg total) by mouth 2 (two) times daily. 08/02/12 08/02/13  Crist Infante, MD  sucralfate (CARAFATE) 1 GM/10ML suspension Take 10 mLs (1 g total) by mouth every 6 (six) hours. 08/02/12  09/01/12  Crist Infante, MD   BP 122/72 mmHg  Pulse 68  Temp(Src) 98.4 F (36.9 C) (Oral)  Resp 14  SpO2 99%  LMP 07/28/2012 Physical Exam  Constitutional: She is oriented to person, place, and time. She appears well-developed and well-nourished. No distress.  HENT:  Head: Normocephalic.  Eyes: Conjunctivae are normal. Pupils are equal, round, and reactive to light. No scleral icterus.  Neck: Normal range of motion. Neck supple. No thyromegaly present.  Cardiovascular: Normal rate and regular rhythm.  Exam reveals no gallop and no friction rub.   No murmur heard. Pulmonary/Chest: Effort normal and breath sounds normal. No respiratory distress. She has no wheezes. She has no rales.  Abdominal: Soft. Bowel sounds are normal. She exhibits no distension. There is no tenderness. There is no rebound.  Musculoskeletal: Normal range of motion.  Neurological: She is alert and oriented to person, place, and time.  Complains of "dizziness" with head movements. No inducible nystagmus noted. Cranial nerves intact symmetric.  Skin: Skin is warm and dry. No rash noted.  Psychiatric: She has a normal mood and affect. Her behavior is normal.    ED Course  Procedures (including critical care time) Labs Review Labs Reviewed  BASIC METABOLIC PANEL - Abnormal; Notable for the following:    Glucose, Bld 118 (*)    GFR calc non Af Amer 86 (*)    All other components within normal limits  CBC WITH DIFFERENTIAL/PLATELET    Imaging Review Ct Head Wo Contrast  04/07/2015   CLINICAL DATA:  Intermittent dizziness and vomiting for 7 weeks. No known injury.  EXAM: CT HEAD WITHOUT CONTRAST  TECHNIQUE: Contiguous axial images were obtained from the base of the skull through the vertex without intravenous contrast.  COMPARISON:  Head CT scan 03/21/2014.  FINDINGS: The brain appears normal without hemorrhage, infarct, mass lesion, mass effect, midline shift or abnormal extra-axial fluid collection. No hydrocephalus  or pneumocephalus. The calvarium is intact. Imaged paranasal sinuses and mastoid air cells are clear.  IMPRESSION: Negative head CT.   Electronically Signed   By: Inge Rise M.D.   On: 04/07/2015 09:46     EKG Interpretation None      MDM   Final diagnoses:  Vertigo     Patient with improvement of her dizziness. Nausea improved, but not resolved. Normal CT. Think she is appropriate for outpatient treatment. Given ENT follow-up. Meclizine with instructions. Vertigo precautions.   Tanna Furry, MD 04/07/15 1102

## 2015-04-07 NOTE — ED Notes (Signed)
Unable to get blood at this time CT has her

## 2015-04-07 NOTE — ED Notes (Signed)
Bed: WA03 Expected date:  Expected time:  Means of arrival:  Comments: 

## 2015-04-07 NOTE — ED Notes (Signed)
Pt c/o intermittent dizziness and emesis x 7 weeks.  Denies pain.  Pt sts she was having projectile vomiting over the weekend.  Pt has not been seen for previous episodes.

## 2015-04-19 DIAGNOSIS — R42 Dizziness and giddiness: Secondary | ICD-10-CM | POA: Diagnosis not present

## 2015-04-19 DIAGNOSIS — G43109 Migraine with aura, not intractable, without status migrainosus: Secondary | ICD-10-CM | POA: Diagnosis not present

## 2015-06-20 ENCOUNTER — Other Ambulatory Visit: Payer: Self-pay

## 2015-07-05 DIAGNOSIS — H2513 Age-related nuclear cataract, bilateral: Secondary | ICD-10-CM | POA: Diagnosis not present

## 2015-12-28 DIAGNOSIS — Z1231 Encounter for screening mammogram for malignant neoplasm of breast: Secondary | ICD-10-CM | POA: Diagnosis not present

## 2016-01-26 DIAGNOSIS — Z0100 Encounter for examination of eyes and vision without abnormal findings: Secondary | ICD-10-CM | POA: Diagnosis not present

## 2016-01-26 DIAGNOSIS — H2513 Age-related nuclear cataract, bilateral: Secondary | ICD-10-CM | POA: Diagnosis not present

## 2016-05-03 DIAGNOSIS — L570 Actinic keratosis: Secondary | ICD-10-CM | POA: Diagnosis not present

## 2016-05-03 DIAGNOSIS — C4441 Basal cell carcinoma of skin of scalp and neck: Secondary | ICD-10-CM | POA: Diagnosis not present

## 2016-05-04 DIAGNOSIS — C4441 Basal cell carcinoma of skin of scalp and neck: Secondary | ICD-10-CM | POA: Diagnosis not present

## 2016-09-14 DIAGNOSIS — L659 Nonscarring hair loss, unspecified: Secondary | ICD-10-CM | POA: Diagnosis not present

## 2016-09-14 DIAGNOSIS — L259 Unspecified contact dermatitis, unspecified cause: Secondary | ICD-10-CM | POA: Diagnosis not present

## 2016-09-14 DIAGNOSIS — R413 Other amnesia: Secondary | ICD-10-CM | POA: Diagnosis not present

## 2016-09-14 DIAGNOSIS — R7309 Other abnormal glucose: Secondary | ICD-10-CM | POA: Diagnosis not present

## 2016-09-14 DIAGNOSIS — E784 Other hyperlipidemia: Secondary | ICD-10-CM | POA: Diagnosis not present

## 2016-09-14 DIAGNOSIS — R5383 Other fatigue: Secondary | ICD-10-CM | POA: Diagnosis not present

## 2016-09-14 DIAGNOSIS — F329 Major depressive disorder, single episode, unspecified: Secondary | ICD-10-CM | POA: Diagnosis not present

## 2016-09-14 DIAGNOSIS — R32 Unspecified urinary incontinence: Secondary | ICD-10-CM | POA: Diagnosis not present

## 2016-09-14 DIAGNOSIS — Z0000 Encounter for general adult medical examination without abnormal findings: Secondary | ICD-10-CM | POA: Diagnosis not present

## 2016-09-20 DIAGNOSIS — H2512 Age-related nuclear cataract, left eye: Secondary | ICD-10-CM | POA: Diagnosis not present

## 2016-09-26 DIAGNOSIS — H25812 Combined forms of age-related cataract, left eye: Secondary | ICD-10-CM | POA: Diagnosis not present

## 2016-09-26 DIAGNOSIS — H2512 Age-related nuclear cataract, left eye: Secondary | ICD-10-CM | POA: Diagnosis not present

## 2017-01-21 DIAGNOSIS — Z1212 Encounter for screening for malignant neoplasm of rectum: Secondary | ICD-10-CM | POA: Diagnosis not present

## 2017-01-21 DIAGNOSIS — Z1211 Encounter for screening for malignant neoplasm of colon: Secondary | ICD-10-CM | POA: Diagnosis not present

## 2017-02-13 DIAGNOSIS — Z1231 Encounter for screening mammogram for malignant neoplasm of breast: Secondary | ICD-10-CM | POA: Diagnosis not present

## 2017-03-07 DIAGNOSIS — H1033 Unspecified acute conjunctivitis, bilateral: Secondary | ICD-10-CM | POA: Diagnosis not present

## 2017-03-21 DIAGNOSIS — H1033 Unspecified acute conjunctivitis, bilateral: Secondary | ICD-10-CM | POA: Diagnosis not present

## 2017-04-19 DIAGNOSIS — H1033 Unspecified acute conjunctivitis, bilateral: Secondary | ICD-10-CM | POA: Diagnosis not present

## 2017-04-19 DIAGNOSIS — Z961 Presence of intraocular lens: Secondary | ICD-10-CM | POA: Diagnosis not present

## 2017-08-29 ENCOUNTER — Encounter (HOSPITAL_COMMUNITY): Payer: Self-pay | Admitting: *Deleted

## 2017-08-29 ENCOUNTER — Inpatient Hospital Stay (HOSPITAL_COMMUNITY)
Admission: EM | Admit: 2017-08-29 | Discharge: 2017-08-31 | DRG: 103 | Disposition: A | Discharge status: Home / Self Care | Payer: Medicare HMO | Attending: Internal Medicine | Admitting: Internal Medicine

## 2017-08-29 ENCOUNTER — Emergency Department (HOSPITAL_COMMUNITY): Payer: Medicare HMO

## 2017-08-29 DIAGNOSIS — Z79899 Other long term (current) drug therapy: Secondary | ICD-10-CM

## 2017-08-29 DIAGNOSIS — R402252 Coma scale, best verbal response, oriented, at arrival to emergency department: Secondary | ICD-10-CM | POA: Diagnosis present

## 2017-08-29 DIAGNOSIS — Z888 Allergy status to other drugs, medicaments and biological substances status: Secondary | ICD-10-CM | POA: Diagnosis not present

## 2017-08-29 DIAGNOSIS — R402142 Coma scale, eyes open, spontaneous, at arrival to emergency department: Secondary | ICD-10-CM | POA: Diagnosis present

## 2017-08-29 DIAGNOSIS — F419 Anxiety disorder, unspecified: Secondary | ICD-10-CM | POA: Diagnosis present

## 2017-08-29 DIAGNOSIS — Z90710 Acquired absence of both cervix and uterus: Secondary | ICD-10-CM

## 2017-08-29 DIAGNOSIS — E785 Hyperlipidemia, unspecified: Secondary | ICD-10-CM | POA: Diagnosis present

## 2017-08-29 DIAGNOSIS — Z91048 Other nonmedicinal substance allergy status: Secondary | ICD-10-CM | POA: Diagnosis not present

## 2017-08-29 DIAGNOSIS — F329 Major depressive disorder, single episode, unspecified: Secondary | ICD-10-CM | POA: Diagnosis present

## 2017-08-29 DIAGNOSIS — G43109 Migraine with aura, not intractable, without status migrainosus: Secondary | ICD-10-CM | POA: Diagnosis present

## 2017-08-29 DIAGNOSIS — G459 Transient cerebral ischemic attack, unspecified: Secondary | ICD-10-CM | POA: Diagnosis not present

## 2017-08-29 DIAGNOSIS — Z8249 Family history of ischemic heart disease and other diseases of the circulatory system: Secondary | ICD-10-CM | POA: Diagnosis not present

## 2017-08-29 DIAGNOSIS — Z885 Allergy status to narcotic agent status: Secondary | ICD-10-CM | POA: Diagnosis not present

## 2017-08-29 DIAGNOSIS — Z8673 Personal history of transient ischemic attack (TIA), and cerebral infarction without residual deficits: Secondary | ICD-10-CM

## 2017-08-29 DIAGNOSIS — Z833 Family history of diabetes mellitus: Secondary | ICD-10-CM | POA: Diagnosis not present

## 2017-08-29 DIAGNOSIS — R200 Anesthesia of skin: Secondary | ICD-10-CM | POA: Diagnosis present

## 2017-08-29 DIAGNOSIS — H539 Unspecified visual disturbance: Secondary | ICD-10-CM | POA: Diagnosis not present

## 2017-08-29 DIAGNOSIS — Z823 Family history of stroke: Secondary | ICD-10-CM | POA: Diagnosis not present

## 2017-08-29 DIAGNOSIS — R531 Weakness: Secondary | ICD-10-CM | POA: Diagnosis not present

## 2017-08-29 DIAGNOSIS — R402362 Coma scale, best motor response, obeys commands, at arrival to emergency department: Secondary | ICD-10-CM | POA: Diagnosis present

## 2017-08-29 DIAGNOSIS — N3281 Overactive bladder: Secondary | ICD-10-CM | POA: Diagnosis present

## 2017-08-29 DIAGNOSIS — L659 Nonscarring hair loss, unspecified: Secondary | ICD-10-CM | POA: Diagnosis present

## 2017-08-29 DIAGNOSIS — Z886 Allergy status to analgesic agent status: Secondary | ICD-10-CM

## 2017-08-29 DIAGNOSIS — I509 Heart failure, unspecified: Secondary | ICD-10-CM | POA: Diagnosis present

## 2017-08-29 DIAGNOSIS — R202 Paresthesia of skin: Secondary | ICD-10-CM | POA: Diagnosis not present

## 2017-08-29 DIAGNOSIS — K219 Gastro-esophageal reflux disease without esophagitis: Secondary | ICD-10-CM | POA: Diagnosis present

## 2017-08-29 LAB — COMPREHENSIVE METABOLIC PANEL
ALBUMIN: 4.3 g/dL (ref 3.5–5.0)
ALT: 17 U/L (ref 14–54)
AST: 23 U/L (ref 15–41)
Alkaline Phosphatase: 66 U/L (ref 38–126)
Anion gap: 10 (ref 5–15)
BILIRUBIN TOTAL: 0.6 mg/dL (ref 0.3–1.2)
BUN: 15 mg/dL (ref 6–20)
CO2: 27 mmol/L (ref 22–32)
CREATININE: 0.72 mg/dL (ref 0.44–1.00)
Calcium: 9.5 mg/dL (ref 8.9–10.3)
Chloride: 104 mmol/L (ref 101–111)
GFR calc Af Amer: >60 mL/min (ref >60)
GLUCOSE: 122 mg/dL — AB (ref 65–99)
Potassium: 4 mmol/L (ref 3.5–5.1)
Sodium: 141 mmol/L (ref 135–145)
TOTAL PROTEIN: 7.3 g/dL (ref 6.5–8.1)

## 2017-08-29 LAB — CBC
HCT: 44.8 % (ref 36.0–46.0)
Hemoglobin: 14.5 g/dL (ref 12.0–15.0)
MCH: 29 pg (ref 26.0–34.0)
MCHC: 32.4 g/dL (ref 30.0–36.0)
MCV: 89.6 fL (ref 78.0–100.0)
Platelets: 254 10*3/uL (ref 150–400)
RBC: 5 MIL/uL (ref 3.87–5.11)
RDW: 13.9 % (ref 11.5–15.5)
WBC: 5.4 10*3/uL (ref 4.0–10.5)

## 2017-08-29 LAB — CBG MONITORING, ED: Glucose-Capillary: 92 mg/dL (ref 65–99)

## 2017-08-29 LAB — I-STAT CHEM 8, ED
BUN: 18 mg/dL (ref 6–20)
Calcium, Ion: 1.18 mmol/L (ref 1.15–1.40)
Chloride: 104 mmol/L (ref 101–111)
Creatinine, Ser: 0.7 mg/dL (ref 0.44–1.00)
GLUCOSE: 112 mg/dL — AB (ref 65–99)
HCT: 44 % (ref 36.0–46.0)
Hemoglobin: 15 g/dL (ref 12.0–15.0)
Potassium: 4.1 mmol/L (ref 3.5–5.1)
Sodium: 143 mmol/L (ref 135–145)
TCO2: 28 mmol/L (ref 22–32)

## 2017-08-29 LAB — DIFFERENTIAL
BASOS ABS: 0 10*3/uL (ref 0.0–0.1)
Basophils Relative: 0 %
Eosinophils Absolute: 0.1 10*3/uL (ref 0.0–0.7)
Eosinophils Relative: 2 %
LYMPHS ABS: 2.5 10*3/uL (ref 0.7–4.0)
Lymphocytes Relative: 46 %
Monocytes Absolute: 0.4 10*3/uL (ref 0.1–1.0)
Monocytes Relative: 7 %
NEUTROS ABS: 2.4 10*3/uL (ref 1.7–7.7)
Neutrophils Relative %: 45 %

## 2017-08-29 LAB — I-STAT TROPONIN, ED: TROPONIN I, POC: 0 ng/mL (ref 0.00–0.08)

## 2017-08-29 LAB — PROTIME-INR
INR: 0.92
Prothrombin Time: 12.2 seconds (ref 11.4–15.2)

## 2017-08-29 LAB — APTT: aPTT: 28 seconds (ref 24–36)

## 2017-08-29 MED ORDER — SODIUM CHLORIDE 0.9 % IV BOLUS (SEPSIS)
1000.0000 mL | Freq: Once | INTRAVENOUS | Status: AC
  Administered 2017-08-30: 1000 mL via INTRAVENOUS

## 2017-08-29 NOTE — ED Notes (Signed)
Patient taken from [facility] to [facility]

## 2017-08-29 NOTE — ED Triage Notes (Addendum)
Pt c/o right temporal pain with generalized weakness. Pt reports "having blacking out episodes for a few seconds" with diaphoresis. Pt was told to come here by Dr.Patterson, PCP, for a stroke rule out; has hx of TIAs. No neuro deficits in triage

## 2017-08-30 ENCOUNTER — Observation Stay (HOSPITAL_COMMUNITY): Payer: Medicare HMO

## 2017-08-30 ENCOUNTER — Emergency Department (HOSPITAL_COMMUNITY): Payer: Medicare HMO

## 2017-08-30 ENCOUNTER — Observation Stay (HOSPITAL_BASED_OUTPATIENT_CLINIC_OR_DEPARTMENT_OTHER)
Admit: 2017-08-30 | Discharge: 2017-08-30 | Disposition: A | Discharge status: Home / Self Care | Payer: Medicare HMO | Attending: Neurology | Admitting: Neurology

## 2017-08-30 ENCOUNTER — Encounter (HOSPITAL_COMMUNITY): Payer: Self-pay

## 2017-08-30 DIAGNOSIS — Z91048 Other nonmedicinal substance allergy status: Secondary | ICD-10-CM | POA: Diagnosis not present

## 2017-08-30 DIAGNOSIS — K219 Gastro-esophageal reflux disease without esophagitis: Secondary | ICD-10-CM | POA: Diagnosis present

## 2017-08-30 DIAGNOSIS — R402362 Coma scale, best motor response, obeys commands, at arrival to emergency department: Secondary | ICD-10-CM | POA: Diagnosis present

## 2017-08-30 DIAGNOSIS — Z885 Allergy status to narcotic agent status: Secondary | ICD-10-CM | POA: Diagnosis not present

## 2017-08-30 DIAGNOSIS — G459 Transient cerebral ischemic attack, unspecified: Secondary | ICD-10-CM

## 2017-08-30 DIAGNOSIS — R402252 Coma scale, best verbal response, oriented, at arrival to emergency department: Secondary | ICD-10-CM | POA: Diagnosis present

## 2017-08-30 DIAGNOSIS — Z888 Allergy status to other drugs, medicaments and biological substances status: Secondary | ICD-10-CM | POA: Diagnosis not present

## 2017-08-30 DIAGNOSIS — E785 Hyperlipidemia, unspecified: Secondary | ICD-10-CM | POA: Diagnosis present

## 2017-08-30 DIAGNOSIS — N3281 Overactive bladder: Secondary | ICD-10-CM | POA: Diagnosis present

## 2017-08-30 DIAGNOSIS — I509 Heart failure, unspecified: Secondary | ICD-10-CM | POA: Diagnosis present

## 2017-08-30 DIAGNOSIS — H539 Unspecified visual disturbance: Secondary | ICD-10-CM | POA: Diagnosis not present

## 2017-08-30 DIAGNOSIS — Z90710 Acquired absence of both cervix and uterus: Secondary | ICD-10-CM | POA: Diagnosis not present

## 2017-08-30 DIAGNOSIS — Z833 Family history of diabetes mellitus: Secondary | ICD-10-CM | POA: Diagnosis not present

## 2017-08-30 DIAGNOSIS — Z886 Allergy status to analgesic agent status: Secondary | ICD-10-CM | POA: Diagnosis not present

## 2017-08-30 DIAGNOSIS — R402142 Coma scale, eyes open, spontaneous, at arrival to emergency department: Secondary | ICD-10-CM | POA: Diagnosis present

## 2017-08-30 DIAGNOSIS — Z8673 Personal history of transient ischemic attack (TIA), and cerebral infarction without residual deficits: Secondary | ICD-10-CM | POA: Diagnosis not present

## 2017-08-30 DIAGNOSIS — G43109 Migraine with aura, not intractable, without status migrainosus: Secondary | ICD-10-CM | POA: Diagnosis present

## 2017-08-30 DIAGNOSIS — Z823 Family history of stroke: Secondary | ICD-10-CM | POA: Diagnosis not present

## 2017-08-30 DIAGNOSIS — F419 Anxiety disorder, unspecified: Secondary | ICD-10-CM | POA: Diagnosis present

## 2017-08-30 DIAGNOSIS — L659 Nonscarring hair loss, unspecified: Secondary | ICD-10-CM | POA: Diagnosis present

## 2017-08-30 DIAGNOSIS — Z8249 Family history of ischemic heart disease and other diseases of the circulatory system: Secondary | ICD-10-CM | POA: Diagnosis not present

## 2017-08-30 DIAGNOSIS — Z79899 Other long term (current) drug therapy: Secondary | ICD-10-CM | POA: Diagnosis not present

## 2017-08-30 DIAGNOSIS — F329 Major depressive disorder, single episode, unspecified: Secondary | ICD-10-CM | POA: Diagnosis present

## 2017-08-30 DIAGNOSIS — R200 Anesthesia of skin: Secondary | ICD-10-CM | POA: Diagnosis present

## 2017-08-30 LAB — URINALYSIS, ROUTINE W REFLEX MICROSCOPIC
Bilirubin Urine: NEGATIVE
Glucose, UA: NEGATIVE mg/dL
Ketones, ur: NEGATIVE mg/dL
NITRITE: NEGATIVE
PROTEIN: NEGATIVE mg/dL
SPECIFIC GRAVITY, URINE: 1.01 (ref 1.005–1.030)
pH: 5 (ref 5.0–8.0)

## 2017-08-30 LAB — HEMOGLOBIN A1C
Hgb A1c MFr Bld: 5.9 % — ABNORMAL HIGH (ref 4.8–5.6)
MEAN PLASMA GLUCOSE: 122.63 mg/dL

## 2017-08-30 LAB — ECHOCARDIOGRAM COMPLETE
Height: 64 in
Weight: 2516.8 oz

## 2017-08-30 LAB — LIPID PANEL
CHOL/HDL RATIO: 3.5 ratio
CHOLESTEROL: 156 mg/dL (ref 0–200)
HDL: 44 mg/dL (ref >40)
LDL Cholesterol: 73 mg/dL (ref 0–99)
Triglycerides: 193 mg/dL — ABNORMAL HIGH (ref <150)
VLDL: 39 mg/dL (ref 0–40)

## 2017-08-30 LAB — SEDIMENTATION RATE: SED RATE: 8 mm/h (ref 0–22)

## 2017-08-30 MED ORDER — VITAMIN D (ERGOCALCIFEROL) 1.25 MG (50000 UNIT) PO CAPS: 50000.0000 [IU] | ORAL_CAPSULE | ORAL | Status: DC

## 2017-08-30 MED ORDER — PANTOPRAZOLE SODIUM 40 MG PO TBEC
40.0000 mg | DELAYED_RELEASE_TABLET | Freq: Every day | ORAL | Status: DC
  Administered 2017-08-30 – 2017-08-31 (×2): 40 mg via ORAL
  Filled 2017-08-30 (×2): qty 1

## 2017-08-30 MED ORDER — PROSIGHT PO TABS: 1.0000 | ORAL_TABLET | Freq: Every day | ORAL | Status: DC

## 2017-08-30 MED ORDER — ALPRAZOLAM 0.5 MG PO TABS
0.5000 mg | ORAL_TABLET | Freq: Every day | ORAL | Status: DC
  Administered 2017-08-30 (×2): 0.5 mg via ORAL
  Filled 2017-08-30 (×2): qty 1

## 2017-08-30 MED ORDER — PRAVASTATIN SODIUM 20 MG PO TABS
20.0000 mg | ORAL_TABLET | Freq: Every day | ORAL | Status: DC
  Administered 2017-08-30 (×2): 20 mg via ORAL
  Filled 2017-08-30 (×2): qty 1

## 2017-08-30 MED ORDER — PROSIGHT PO TABS
1.0000 | ORAL_TABLET | Freq: Every day | ORAL | Status: DC
  Administered 2017-08-30 – 2017-08-31 (×2): 1 via ORAL
  Filled 2017-08-30 (×3): qty 1

## 2017-08-30 MED ORDER — ACETAMINOPHEN 325 MG PO TABS
650.0000 mg | ORAL_TABLET | ORAL | Status: DC | PRN
  Administered 2017-08-30: 650 mg via ORAL
  Filled 2017-08-30: qty 2

## 2017-08-30 MED ORDER — ACETAMINOPHEN 160 MG/5ML PO SOLN: 650.0000 mg | ORAL | Status: DC | PRN

## 2017-08-30 MED ORDER — MIRABEGRON ER 50 MG PO TB24
25.0000 mg | ORAL_TABLET | Freq: Every day | ORAL | Status: DC
  Administered 2017-08-30: 50 mg via ORAL
  Filled 2017-08-30 (×2): qty 1

## 2017-08-30 MED ORDER — LORATADINE 10 MG PO TABS
10.0000 mg | ORAL_TABLET | Freq: Every day | ORAL | Status: DC
  Administered 2017-08-30 – 2017-08-31 (×2): 10 mg via ORAL
  Filled 2017-08-30 (×2): qty 1

## 2017-08-30 MED ORDER — STROKE: EARLY STAGES OF RECOVERY BOOK
Freq: Once | Status: AC
  Administered 2017-08-30: 03:00:00
  Filled 2017-08-30: qty 1

## 2017-08-30 MED ORDER — ACETAMINOPHEN 650 MG RE SUPP: 650.0000 mg | RECTAL | Status: DC | PRN

## 2017-08-30 MED ORDER — KETOROLAC TROMETHAMINE 15 MG/ML IJ SOLN
15.0000 mg | Freq: Once | INTRAMUSCULAR | Status: AC
  Administered 2017-08-30: 15 mg via INTRAVENOUS
  Filled 2017-08-30: qty 1

## 2017-08-30 MED ORDER — METOCLOPRAMIDE HCL 5 MG/ML IJ SOLN
10.0000 mg | Freq: Once | INTRAMUSCULAR | Status: AC
  Administered 2017-08-30: 10 mg via INTRAVENOUS
  Filled 2017-08-30: qty 2

## 2017-08-30 MED ORDER — CLOPIDOGREL BISULFATE 75 MG PO TABS
75.0000 mg | ORAL_TABLET | Freq: Every day | ORAL | Status: DC
  Administered 2017-08-30 – 2017-08-31 (×2): 75 mg via ORAL
  Filled 2017-08-30 (×2): qty 1

## 2017-08-30 MED ORDER — SENNOSIDES-DOCUSATE SODIUM 8.6-50 MG PO TABS: 1.0000 | ORAL_TABLET | Freq: Every evening | ORAL | Status: DC | PRN

## 2017-08-30 MED ORDER — ENOXAPARIN SODIUM 40 MG/0.4ML ~~LOC~~ SOLN
40.0000 mg | SUBCUTANEOUS | Status: DC
  Administered 2017-08-30 – 2017-08-31 (×2): 40 mg via SUBCUTANEOUS
  Filled 2017-08-30 (×2): qty 0.4

## 2017-08-30 MED ORDER — MINOXIDIL 2.5 MG PO TABS
2.5000 mg | ORAL_TABLET | Freq: Every day | ORAL | Status: DC
  Administered 2017-08-30 (×2): 2.5 mg via ORAL
  Filled 2017-08-30 (×2): qty 1

## 2017-08-30 NOTE — ED Provider Notes (Signed)
[facility] DEPT Provider Note   CSN: [id number] Arrival date & time: 08/29/17  6010     History   Chief Complaint Chief Complaint  Patient presents with  . Transient Ischemic Attack    HPI Alison Mcguire is a 76 y.o. female.  76 yo F with a chief complaint of changes to her vision left arm numbness and a general feeling of unwellness. This been going on past 4 days. Last for short time and then resolves. She has a very remote history of a TIA about 30 years ago. She also has a family history of stroke. She called her family physician who felt that she should come immediately to the department and be evaluated for a stroke. She has been working out in [employer] a lot recently. Is lightheaded upon standing. Denies nausea vomiting or diarrhea. Denies dark stool or blood in her stool.   The history is provided by the patient.  Illness  This is a new problem. The current episode started more than 2 days ago. The problem occurs constantly. The problem has not changed since onset.Pertinent negatives include no chest pain, no headaches and no shortness of breath. Nothing aggravates the symptoms. Nothing relieves the symptoms. She has tried nothing for the symptoms. The treatment provided no relief.    Past Medical History:  Diagnosis Date  . Acid reflux   . Anxiety   . Depression   . GI bleed   . Hyperlipidemia     Patient Active Problem List   Diagnosis Date Noted  . TIA (transient ischemic attack) 08/30/2017  . Colitis 07/28/2012  . Bloody diarrhea 07/28/2012  . Abdominal pain 07/28/2012  . GERD (gastroesophageal reflux disease) 07/28/2012    Past Surgical History:  Procedure Laterality Date  . ABDOMINAL HYSTERECTOMY    . BREAST SURGERY      OB History    No data available       Home Medications    Prior to Admission medications   Medication Sig Start Date End Date Taking? Authorizing Provider  ALPRAZolam Duanne Moron) 0.5 MG tablet Take 0.5 mg by mouth at  bedtime.   Yes [provider]  Biotin 5000 MCG CAPS Take 1 capsule by mouth at bedtime.   Yes [provider]  calcium carbonate (OSCAL) 1500 (600 Ca) MG TABS tablet Take by mouth 2 (two) times daily with a meal.   Yes [provider]  cetirizine (ZYRTEC) 10 MG tablet Take 10 mg by mouth daily.   Yes [provider]  GLUCOS-CHONDROIT-HYALURON-MSM PO Take 1 tablet by mouth daily.   Yes [provider]  minoxidil (LONITEN) 2.5 MG tablet Take 2.5 mg by mouth at bedtime.    Yes [provider]  Multiple Vitamins-Minerals (HAIR/SKIN/NAILS PO) Take 1 tablet by mouth 2 (two) times daily.   Yes [provider]  Multiple Vitamins-Minerals (MULTIVITAMIN WITH MINERALS) tablet Take 1 tablet by mouth daily.   Yes [provider]  multivitamin-lutein (OCUVITE-LUTEIN) CAPS capsule Take 1 capsule by mouth daily.   Yes [provider]  MYRBETRIQ 25 MG TB24 tablet Take 25 mg by mouth daily. 07/26/17  Yes [provider]  omeprazole (PRILOSEC) 20 MG capsule Take 20 mg by mouth daily.   Yes [provider]  pravastatin (PRAVACHOL) 20 MG tablet Take 20 mg by mouth at bedtime.    Yes [provider]  Vitamin D, Ergocalciferol, (DRISDOL) 50000 UNITS CAPS Take 50,000 Units by mouth every 7 (seven) days. Every Wednesday  Yes [provider]  ondansetron (ZOFRAN ODT) 4 MG disintegrating tablet Take 1 tablet (4 mg total) by mouth every 8 (eight) hours as needed for nausea. Patient not taking: Reported on 08/30/2017 04/07/15   Tanna Furry, MD  ondansetron (ZOFRAN ODT) 4 MG disintegrating tablet Take 1 tablet (4 mg total) by mouth every 8 (eight) hours as needed for nausea. Patient not taking: Reported on 08/30/2017 04/07/15   Tanna Furry, MD  pantoprazole (PROTONIX) 40 MG tablet Take 1 tablet (40 mg total) by mouth 2 (two) times daily. 08/02/12 08/02/13  Crist Infante, MD  sucralfate (CARAFATE) 1 GM/10ML suspension Take  10 mLs (1 g total) by mouth every 6 (six) hours. 08/02/12 09/01/12  Crist Infante, MD    Family History Family History  Problem Relation Age of Onset  . Hypertension Sister   . Diabetes Son   . Stroke Sister   . Diabetes Sister     Social History Social History  Substance Use Topics  . Smoking status: Never Smoker  . Smokeless tobacco: Never Used  . Alcohol use No     Allergies   Codeine; Aspirin; Clinoril [sulindac]; Demerol [meperidine]; Dilaudid [hydromorphone hcl]; Ether; Soma [carisoprodol]; Betadine [povidone iodine]; and Nickel   Review of Systems Review of Systems  Constitutional: Negative for chills and fever.  HENT: Negative for congestion and rhinorrhea.   Eyes: Positive for visual disturbance. Negative for redness.  Respiratory: Negative for shortness of breath and wheezing.   Cardiovascular: Negative for chest pain and palpitations.  Gastrointestinal: Negative for nausea and vomiting.  Genitourinary: Negative for dysuria and urgency.  Musculoskeletal: Negative for arthralgias and myalgias.  Skin: Negative for pallor and wound.  Neurological: Positive for light-headedness. Negative for dizziness and headaches.     Physical Exam Updated Vital Signs BP (!) 141/91 (BP Location: Left Arm)   Pulse 85   Temp 98.2 F (36.8 C) (Oral)   Resp 18   Ht 5\' 4"  (1.626 m)   Wt 71.4 kg (157 lb 4.8 oz)   LMP 07/28/2012   SpO2 99%   BMI 27.00 kg/m   Physical Exam  Constitutional: She is oriented to person, place, and time. She appears well-developed and well-nourished. No distress.  HENT:  Head: Normocephalic and atraumatic.  Eyes: Pupils are equal, round, and reactive to light. EOM are normal.  Neck: Normal range of motion. Neck supple.  Cardiovascular: Normal rate and regular rhythm.  Exam reveals no gallop and no friction rub.   No murmur heard. Pulmonary/Chest: Effort normal. She has no wheezes. She has no rales.  Abdominal: Soft. She exhibits no distension.  There is no tenderness.  Musculoskeletal: She exhibits no edema or tenderness.  Neurological: She is alert and oriented to person, place, and time. She has normal strength. No cranial nerve deficit or sensory deficit. She displays a negative Romberg sign. Coordination and gait normal. GCS eye subscore is 4. GCS verbal subscore is 5. GCS motor subscore is 6. She displays no Babinski's sign on the right side. She displays no Babinski's sign on the left side.  Reflex Scores:      Tricep reflexes are 2+ on the right side and 2+ on the left side.      Bicep reflexes are 2+ on the right side and 2+ on the left side.      Brachioradialis reflexes are 2+ on the right side and 2+ on the left side.      Patellar reflexes are 2+ on the right side and  2+ on the left side.      Achilles reflexes are 2+ on the right side and 2+ on the left side. Skin: Skin is warm and dry. She is not diaphoretic.  Psychiatric: She has a normal mood and affect. Her behavior is normal.  Nursing note and vitals reviewed.    ED Treatments / Results  Labs (all labs ordered are listed, but only abnormal results are displayed) Labs Reviewed  COMPREHENSIVE METABOLIC PANEL - Abnormal; Notable for the following:       Result Value   Glucose, Bld 122 (*)    All other components within normal limits  URINALYSIS, ROUTINE W REFLEX MICROSCOPIC - Abnormal; Notable for the following:    APPearance HAZY (*)    Hgb urine dipstick SMALL (*)    Leukocytes, UA MODERATE (*)    Bacteria, UA MANY (*)    Squamous Epithelial / LPF 0-5 (*)    All other components within normal limits  I-STAT CHEM 8, ED - Abnormal; Notable for the following:    Glucose, Bld 112 (*)    All other components within normal limits  PROTIME-INR  APTT  CBC  DIFFERENTIAL  HEMOGLOBIN A1C  LIPID PANEL  CBG MONITORING, ED  I-STAT TROPONIN, ED    EKG  EKG Interpretation  Date/Time:  Thursday August 29 2017 19:22:34 EDT Ventricular Rate:  91 PR  Interval:  140 QRS Duration: 72 QT Interval:  334 QTC Calculation: 410 R Axis:   55 Text Interpretation:  Normal sinus rhythm Normal ECG No significant change since last tracing Confirmed by Deno Etienne [phone]) on 08/29/2017 11:39:09 PM       Radiology Dg Chest 2 View  Result Date: 08/30/2017 CLINICAL DATA:  Weakness.  Blacking out episodes with diaphoresis. EXAM: CHEST  2 VIEW COMPARISON:  02/14/2005 FINDINGS: Normal heart size and pulmonary vascularity. Chronic blunting of left costophrenic angle may represent thickened pleura. No airspace disease or consolidation in the lungs. No pneumothorax. Mediastinal contours appear intact. Surgical clips at the EG junction. IMPRESSION: No evidence of active pulmonary disease. Probable pleural thickening in the left costophrenic angle. Electronically Signed   By: Lucienne Capers M.D.   On: 08/30/2017 00:16   Ct Head Wo Contrast  Result Date: 08/29/2017 CLINICAL DATA:  Right temporal pain with generalized weakness. Blacking out episodes and diaphoresis. EXAM: CT HEAD WITHOUT CONTRAST TECHNIQUE: Contiguous axial images were obtained from the base of the skull through the vertex without intravenous contrast. COMPARISON:  April 07, 2015 FINDINGS: Brain: No evidence of acute infarction, hemorrhage, hydrocephalus, extra-axial collection or mass lesion/mass effect. Vascular: No hyperdense vessel or unexpected calcification. Skull: Normal. Negative for fracture or focal lesion. Sinuses/Orbits: No acute finding. Other: None. IMPRESSION: No acute intracranial abnormalities identified. Electronically Signed   By: Dorise Bullion III M.D   On: 08/29/2017 20:00    Procedures Procedures (including critical care time)  Medications Ordered in ED Medications  acetaminophen (TYLENOL) tablet 650 mg (not administered)    Or  acetaminophen (TYLENOL) solution 650 mg (not administered)    Or  acetaminophen (TYLENOL) suppository 650 mg (not administered)  senna-docusate  (Senokot-S) tablet 1 tablet (not administered)  enoxaparin (LOVENOX) injection 40 mg (not administered)  pravastatin (PRAVACHOL) tablet 20 mg (20 mg Oral Given 08/30/17 0258)  ALPRAZolam Duanne Moron) tablet 0.5 mg (0.5 mg Oral Given 08/30/17 0257)  multivitamin (PROSIGHT) tablet 1 tablet (not administered)  pantoprazole (PROTONIX) EC tablet 40 mg (not administered)  Vitamin D (Ergocalciferol) (DRISDOL) capsule 50,000 Units (not  administered)  minoxidil (LONITEN) tablet 2.5 mg (2.5 mg Oral Given 08/30/17 0257)  loratadine (CLARITIN) tablet 10 mg (not administered)  mirabegron ER (MYRBETRIQ) tablet 50 mg (not administered)  sodium chloride 0.9 % bolus 1,000 mL (1,000 mLs Intravenous New Bag/Given 08/30/17 0035)   stroke: mapping our early stages of recovery book ( Does not apply Given 08/30/17 0258)     Initial Impression / Assessment and Plan / ED Course  I have reviewed the triage vital signs and the nursing notes.  Pertinent labs & imaging results that were available during my care of the patient were reviewed by me and considered in my medical decision making (see chart for details).     76 yo F With a chief complaint of multiple small episodes that resolved spontaneously. She is concerned that maybe she is having a stroke and was sent by her family physician for the same. There are no neurologic deficits on exam.   Discussed with Dr. Leonel Ramsay, with hx of arm numbness, recommends TIA workup and admission.  Will discuss with hospitalist.   The patients results and plan were reviewed and discussed.   Any x-rays performed were independently reviewed by myself.   Differential diagnosis were considered with the presenting HPI.  Medications  acetaminophen (TYLENOL) tablet 650 mg (not administered)    Or  acetaminophen (TYLENOL) solution 650 mg (not administered)    Or  acetaminophen (TYLENOL) suppository 650 mg (not administered)  senna-docusate (Senokot-S) tablet 1 tablet (not administered)   enoxaparin (LOVENOX) injection 40 mg (not administered)  pravastatin (PRAVACHOL) tablet 20 mg (20 mg Oral Given 08/30/17 0258)  ALPRAZolam Duanne Moron) tablet 0.5 mg (0.5 mg Oral Given 08/30/17 0257)  multivitamin (PROSIGHT) tablet 1 tablet (not administered)  pantoprazole (PROTONIX) EC tablet 40 mg (not administered)  Vitamin D (Ergocalciferol) (DRISDOL) capsule 50,000 Units (not administered)  minoxidil (LONITEN) tablet 2.5 mg (2.5 mg Oral Given 08/30/17 0257)  loratadine (CLARITIN) tablet 10 mg (not administered)  mirabegron ER (MYRBETRIQ) tablet 50 mg (not administered)  sodium chloride 0.9 % bolus 1,000 mL (1,000 mLs Intravenous New Bag/Given 08/30/17 0035)   stroke: mapping our early stages of recovery book ( Does not apply Given 08/30/17 0258)    Vitals:   08/29/17 2115 08/30/17 0030 08/30/17 0100 08/30/17 0200  BP: (!) 142/88 140/77 135/74 (!) 141/91  Pulse: 88 67 80 85  Resp: 18 13 (!) 24 18  Temp:    98.2 F (36.8 C)  TempSrc:    Oral  SpO2: 97% 96% 99% 99%  Weight:    71.4 kg (157 lb 4.8 oz)  Height:    5\' 4"  (1.626 m)    Final diagnoses:  Transient cerebral ischemia, unspecified type  Left arm numbness    Admission/ observation were discussed with the admitting physician, patient and/or family and they are comfortable with the plan.    Final Clinical Impressions(s) / ED Diagnoses   Final diagnoses:  Transient cerebral ischemia, unspecified type  Left arm numbness    New Prescriptions Current Discharge Medication List       Deno Etienne, DO 08/30/17 5400

## 2017-08-30 NOTE — Therapy (Signed)
Occupational Therapy Evaluation and Discharge  Patient Details Name: Alison Mcguire MRN: [id number] DOB: 10-Jun-1941 Today's Date: 08/30/2017    History of Present Illness Pt is a 76 y.o. female who presented to the ER with changes in vision and numbness in the LUE. MRI on 08/30/17 negative for acute infarction. PMH: TIAs 30 years ago, anxiety, hyperlipidemia.    Clinical Impression   Pt reports being independent in ADLs, IADLs and was very active in the community PTA. Pts initial symptoms appear to have resolved and currently is at a modified independent level for all ADLs and functional mobility with increased time for safety. Pt reports that she lives alone with neighbors who are able to provide assistance as needed. No acute OT needs at this time. OT signing off.      Follow Up Recommendations  No OT follow up;Supervision - Intermittent    Equipment Recommendations  None recommended by OT    Recommendations for Other Services       Precautions / Restrictions Precautions Precautions: None Restrictions Weight Bearing Restrictions: No      Mobility Bed Mobility Overal bed mobility: Independent                Transfers Overall transfer level: Modified independent                    Balance Overall balance assessment: No apparent balance deficits (not formally assessed)                                         ADL either performed or assessed with clinical judgement   ADL Overall ADL's : Modified independent                                             Vision Baseline Vision/History: Cataracts (Left eye cataract corrected. Contact in right eye. ) Vision Assessment?: Yes Eye Alignment: Within Functional Limits Ocular Range of Motion: Within Functional Limits Alignment/Gaze Preference: Within Defined Limits Tracking/Visual Pursuits: Able to track stimulus in all quads without difficulty Additional Comments: Pt able to  read menu without difficulty.     Perception     Praxis      Pertinent Vitals/Pain Pain Assessment: No/denies pain     Hand Dominance Right   Extremity/Trunk Assessment Upper Extremity Assessment Upper Extremity Assessment: Overall WFL for tasks assessed   Lower Extremity Assessment Lower Extremity Assessment: Overall WFL for tasks assessed   Cervical / Trunk Assessment Cervical / Trunk Assessment: Normal   Communication Communication Communication: No difficulties   Cognition Arousal/Alertness: Awake/alert Behavior During Therapy: WFL for tasks assessed/performed Overall Cognitive Status: Within Functional Limits for tasks assessed                                     General Comments  Pt reports that symptoms have resolved. Pt has no concerns about returning home. Educated pt on signs/symptoms of a stroke.     Exercises     Shoulder Instructions      Home Living Family/patient expects to be discharged to:: Private residence Living Arrangements: Alone Available Help at Discharge: Neighbor Type of Home: House Home Access: Level entry  Home Layout: One level     Bathroom Shower/Tub: Engineer, manufacturing: Yes How Accessible: Accessible via walker Home Equipment: None          Prior Functioning/Environment Level of Independence: Independent                 OT Problem List:        OT Treatment/Interventions:      OT Goals(Current goals can be found in the care plan section) Acute Rehab OT Goals Patient Stated Goal: To go home OT Goal Formulation: With patient  OT Frequency:     Barriers to D/C:            Co-evaluation              AM-PAC PT "6 Clicks" Daily Activity     Outcome Measure Help from another person eating meals?: None Help from another person taking care of personal grooming?: None Help from another person toileting, which includes using toliet, bedpan,  or urinal?: None Help from another person bathing (including washing, rinsing, drying)?: None Help from another person to put on and taking off regular upper body clothing?: None Help from another person to put on and taking off regular lower body clothing?: None 6 Click Score: 24   End of Session Equipment Utilized During Treatment: Gait belt Nurse Communication: Mobility status  Activity Tolerance: Patient tolerated treatment well Patient left: in chair;with call bell/phone within reach  OT Visit Diagnosis: Unsteadiness on feet (R26.81);Other symptoms and signs involving the nervous system (R29.898)                Time: 7939-0300 OT Time Calculation (min): 23 min Charges:  OT General Charges $OT Visit: 1 Visit OT Evaluation $OT Eval Low Complexity: 1 Low OT Treatments $Self Care/Home Management : 8-22 mins G-Codes:     Boykin Peek, OTS [phone]  Boykin Peek 08/30/2017, 2:40 PM

## 2017-08-30 NOTE — Care Management Note (Signed)
Case Management Note  Patient Details  Name: Alison Mcguire MRN: [id number] Date of Birth: [birth year]  Subjective/Objective:  Pt admitted with TIA. She is from home alone.                   Action/Plan: Awaiting PT/OT recommendations. CM following for d/c needs, physician orders.  Expected Discharge Date:                  Expected Discharge Plan:     In-House Referral:     Discharge planning Services     Post Acute Care Choice:    Choice offered to:     DME Arranged:    DME Agency:     HH Arranged:    HH Agency:     Status of Service:  In process, will continue to follow  If discussed at Long Length of Stay Meetings, dates discussed:    Additional Comments:  Pollie Friar, RN 08/30/2017, 12:20 PM

## 2017-08-30 NOTE — Progress Notes (Signed)
EEG completed; results pending.    

## 2017-08-30 NOTE — Procedures (Signed)
ELECTROENCEPHALOGRAM REPORT  Date of Study: 08/30/2017  Patient's Name: Alison Mcguire MRN: [id number] Date of Birth: 28-Dec-1940  Referring Provider: Dr. Roland Rack  Clinical History: This is a 76 year old woman with transient visual changes, left arm numbness.  Medications: acetaminophen (TYLENOL) tablet 650 mg  ALPRAZolam (XANAX) tablet 0.5 mg  clopidogrel (PLAVIX) tablet 75 mg  enoxaparin (LOVENOX) injection 40 mg  loratadine (CLARITIN) tablet 10 mg  minoxidil (LONITEN) tablet 2.5 mg  mirabegron ER (MYRBETRIQ) tablet 50 mg  multivitamin (PROSIGHT) tablet 1 tablet  pantoprazole (PROTONIX) EC tablet 40 mg  pravastatin (PRAVACHOL) tablet 20 mg  senna-docusate (Senokot-S) tablet 1 tablet  Vitamin D (Ergocalciferol) (DRISDOL) capsule 50,000 Units   Technical Summary: A multichannel digital EEG recording measured by the international 10-20 system with electrodes applied with paste and impedances below 5000 ohms performed in our laboratory with EKG monitoring in an awake and asleep patient.  Hyperventilation was not performed. Photic stimulation was performed.  The digital EEG was referentially recorded, reformatted, and digitally filtered in a variety of bipolar and referential montages for optimal display.    Description: The patient is awake and asleep during the recording.  During maximal wakefulness, there is a symmetric, medium voltage 10 Hz posterior dominant rhythm that attenuates with eye opening.  The record is symmetric.  During drowsiness and sleep, there is an increase in theta slowing of the background.  Vertex waves and symmetric sleep spindles were seen. There were occasional small sharp spikes of sleep noted over the bilateral temporal regions, without clear epileptogenic potential. Photic stimulation did not elicit any abnormalities.  There were no clear epileptiform discharges or electrographic seizures seen.    EKG lead was  unremarkable.  Impression: This awake and asleep EEG is within normal limits.  Clinical Correlation: A normal EEG does not exclude a clinical diagnosis of epilepsy. Clinical correlation is advised.   Ellouise Newer, M.D.

## 2017-08-30 NOTE — Consult Note (Signed)
Neurology Consultation Reason for Consult: Spells Referring Physician: Sheran Luz  CC: Spells  History is obtained from: Patient  HPI: Alison Mcguire is a 76 y.o. female with a history of anxiety, hyperlipidemia who presents with 3 weeks of feeling "not right" but with difficulty specify exactly what hasn't been right. She is noted that her handwriting is changed. She has also noticed intermittent episodes of transient visual change over the past few days. She also had an episode of left arm numbness which lasted about 10 minutes. She states that the entire arm circumferentially went numb below the elbow, no involvement of face or leg. She had no weakness with that episode.  She describes another episode where she became lightheaded and her vision became dark and, she sat down and her symptoms improved. She was working outside of the time.  Due to this episode, there is concern for TIA and she has therefore been admitted for evaluation of such.  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 She is to have very severe migraines, occasionally with aura.  She currently denies headache, but does have discomfort in the right retro-orbital area which has been there for a few days.  LKW: 3 weeks ago tpa given?: no, out of window   ROS: A 14 point ROS was performed and is negative except as noted in the HPI.   Past Medical History:  Diagnosis Date  . Acid reflux   . Anxiety   . Depression   . GI bleed   . Hyperlipidemia      Family History  Problem Relation Age of Onset  . Hypertension Sister   . Diabetes Son   . Stroke Sister   . Diabetes Sister      Social History:  reports that she has never smoked. She has never used smokeless tobacco. She reports that she does not drink alcohol or use drugs.   Exam: Current vital signs: BP (!) 141/91 (BP Location: Left Arm)   Pulse 85   Temp 98.2 F (36.8 C) (Oral)   Resp 18   Ht 5'  4" (1.626 m)   Wt 71.4 kg (157 lb 4.8 oz)   LMP 07/28/2012   SpO2 99%   BMI 27.00 kg/m  Vital signs in last 24 hours: Temp:  [98.1 F (36.7 C)-98.2 F (36.8 C)] 98.2 F (36.8 C) (09/07 0200) Pulse Rate:  [67-93] 85 (09/07 0200) Resp:  [13-24] 18 (09/07 0200) BP: (135-153)/(74-93) 141/91 (09/07 0200) SpO2:  [96 %-99 %] 99 % (09/07 0200) Weight:  [71.4 kg (157 lb 4.8 oz)] 71.4 kg (157 lb 4.8 oz) (09/07 0200)   Physical Exam  Constitutional: Appears well-developed and well-nourished.  Psych: Affect appropriate to situation Eyes: No scleral injection HENT: No OP obstrucion Head: Normocephalic.  Cardiovascular: Normal rate and regular rhythm.  Respiratory: Effort normal and breath sounds normal to anterior ascultation GI: Soft.  No distension. There is no tenderness.  Skin: WDI  Neuro: Mental Status: Patient is awake, alert, oriented to person, place, month, year, and situation. Patient is able to give a clear and coherent history. No signs of aphasia or neglect Cranial Nerves: II: Visual Fields are full. Pupils are equal, round, and reactive to light.   III,IV, VI: EOMI without ptosis or diploplia.  V: Facial sensation is symmetric to temperature VII: Facial movement is symmetric.  VIII: hearing is intact to voice X: Uvula elevates symmetrically XI: Shoulder shrug is symmetric.  XII: tongue is midline without atrophy or fasciculations.  Motor: Tone is normal. Bulk is normal. 5/5 strength was present in all four extremities.  Sensory: Sensation is symmetric to light touch and temperature in the arms and legs. Cerebellar: FNF and HKS are intact bilaterally   I have reviewed labs in epic and the results pertinent to this consultation are: BMP-unremarkable UA-possible UTI  I have reviewed the images obtained: CT head-unremarkable  Impression: 76 year old female with general sense of "something not right" for 3 weeks and transient episode of left arm numbness. Left arm  numbness certainly sounds concerning for possible TIA and I agree with pursuing a workup of this. The transient waxing/waning visual change in the setting of right retro-orbital discomfort does raise the possibility of complex migraine given her history.  Recommendations: 1) MRI brain, MRA head 2) echo, carotids 3) Reglan/Toradol for possible migraine 4) EEG 5) neurology will continue to follow   Roland Rack, MD [facility] [phone]  If 7pm- 7am, please page neurology on call as listed in [place].

## 2017-08-30 NOTE — Progress Notes (Signed)
Patient received via stretcher alert and oriented x 4, skin assessment done by this writer, on skin assessment no pressure ulcer noted.

## 2017-08-30 NOTE — Progress Notes (Signed)
SLP Cancellation Note  Patient Details Name: Alison Mcguire MRN: [id number] DOB: [birth year]   Cancelled treatment:       Reason Eval/Treat Not Completed: SLP screened, no needs identified, will sign off   [name] 08/30/2017, 2:28 PM

## 2017-08-30 NOTE — Evaluation (Signed)
Physical Therapy Evaluation & Discharge Patient Details Name: Alison Mcguire MRN: [id number] DOB: 1941/07/31 Today's Date: 08/30/2017   History of Present Illness  Pt is a 76 y.o. female who presented to the ER with changes in vision and numbness in the LUE. MRI on 08/30/17 negative for acute infarction. PMH: TIAs 30 years ago, anxiety, hyperlipidemia.   Clinical Impression  Pt presented sitting OOB in recliner chair, awake and willing to participate in therapy session. Prior to admission, pt reported that she was independent with all functional mobility and ADLs. Pt ambulated in hallway with supervision without use of any AD. Pt participated in higher level balance assessment and scored a 23/24 on the DGI indicating that she is a safe community ambulator. No further acute PT needs identified at this time. PT signing off.     Follow Up Recommendations No PT follow up    Equipment Recommendations  None recommended by PT    Recommendations for Other Services       Precautions / Restrictions Precautions Precautions: None Restrictions Weight Bearing Restrictions: No      Mobility  Bed Mobility Overal bed mobility: Independent             General bed mobility comments: pt sitting OOB upon arrival  Transfers Overall transfer level: Modified independent                  Ambulation/Gait Ambulation/Gait assistance: Supervision Ambulation Distance (Feet): 300 Feet Assistive device: None Gait Pattern/deviations: WFL(Within Functional Limits) Gait velocity: WFL Gait velocity interpretation: at or above normal speed for age/gender General Gait Details: no instability or LOB  Stairs            Wheelchair Mobility    Modified Rankin (Stroke Patients Only)       Balance Overall balance assessment: Needs assistance Sitting-balance support: Feet supported Sitting balance-Leahy Scale: Normal     Standing balance support: During functional  activity Standing balance-Leahy Scale: Good                   Standardized Balance Assessment Standardized Balance Assessment : Dynamic Gait Index   Dynamic Gait Index Level Surface: Normal Change in Gait Speed: Normal Gait with Horizontal Head Turns: Normal Gait with Vertical Head Turns: Normal Gait and Pivot Turn: Normal Step Over Obstacle: Normal Step Around Obstacles: Normal Steps: Mild Impairment Total Score: 23       Pertinent Vitals/Pain Pain Assessment: No/denies pain    Home Living Family/patient expects to be discharged to:: Private residence Living Arrangements: Alone Available Help at Discharge: Neighbor Type of Home: House Home Access: Level entry     Home Layout: One level Home Equipment: None      Prior Function Level of Independence: Independent               Hand Dominance   Dominant Hand: Right    Extremity/Trunk Assessment   Upper Extremity Assessment Upper Extremity Assessment: Overall WFL for tasks assessed    Lower Extremity Assessment Lower Extremity Assessment: Overall WFL for tasks assessed    Cervical / Trunk Assessment Cervical / Trunk Assessment: Normal  Communication   Communication: No difficulties  Cognition Arousal/Alertness: Awake/alert Behavior During Therapy: WFL for tasks assessed/performed Overall Cognitive Status: Within Functional Limits for tasks assessed  General Comments General comments (skin integrity, edema, etc.): Pt reports that symptoms have resolved. Pt has no concerns about returning home. Educated pt on signs/symptoms of a stroke.     Exercises     Assessment/Plan    PT Assessment Patent does not need any further PT services  PT Problem List         PT Treatment Interventions      PT Goals (Current goals can be found in the Care Plan section)  Acute Rehab PT Goals Patient Stated Goal: To go home    Frequency      Barriers to discharge        Co-evaluation               AM-PAC PT "6 Clicks" Daily Activity  Outcome Measure Difficulty turning over in bed (including adjusting bedclothes, sheets and blankets)?: None Difficulty moving from lying on back to sitting on the side of the bed? : None Difficulty sitting down on and standing up from a chair with arms (e.g., wheelchair, bedside commode, etc,.)?: None Help needed moving to and from a bed to chair (including a wheelchair)?: None Help needed walking in hospital room?: None Help needed climbing 3-5 steps with a railing? : None 6 Click Score: 24    End of Session   Activity Tolerance: Patient tolerated treatment well Patient left: in chair;with call bell/phone within reach Nurse Communication: Mobility status PT Visit Diagnosis: Other symptoms and signs involving the nervous system (R29.898)    Time: 9735-3299 PT Time Calculation (min) (ACUTE ONLY): 27 min   Charges:   PT Evaluation $PT Eval Low Complexity: 1 Low PT Treatments $Gait Training: 8-22 mins   PT G Codes:   PT G-Codes **NOT FOR INPATIENT CLASS** Functional Assessment Tool Used: AM-PAC 6 Clicks Basic Mobility;Clinical judgement Functional Limitation: Mobility: Walking and moving around Mobility: Walking and Moving Around Current Status (M4268): 0 percent impaired, limited or restricted Mobility: Walking and Moving Around Goal Status (T4196): 0 percent impaired, limited or restricted Mobility: Walking and Moving Around Discharge Status (Q2297): 0 percent impaired, limited or restricted    [facility], PT, DPT [place] 08/30/2017, 4:15 PM

## 2017-08-30 NOTE — Care Management Obs Status (Signed)
[place] NOTIFICATION   Patient Details  Name: Alison Mcguire MRN: [id number] Date of Birth: 1941/03/18   Medicare Observation Status Notification Given:  Yes    Pollie Friar, RN 08/30/2017, 11:12 AM

## 2017-08-30 NOTE — Progress Notes (Signed)
Gcode entry    08/30/17 1421  OT Time Calculation  OT Start Time (ACUTE ONLY) 1351  OT Stop Time (ACUTE ONLY) 1414  OT Time Calculation (min) 23 min  OT G-codes **NOT FOR INPATIENT CLASS**  Functional Assessment Tool Used AM-PAC 6 Clicks Daily Activity  Functional Limitation Self care  Self Care Current Status (Y0459) [facility]  Self Care Goal Status (X7741) [facility]  Self Care Discharge Status (S2395) [place]  OT General Charges  $OT Visit 1 Visit  OT Evaluation  $OT Eval Low Complexity 1 Low  OT Treatments  $Self Care/Home Management  8-22 mins   Sherrel Ploch A. Ulice Brilliant, M.S., OTR/L Pager: [phone]

## 2017-08-30 NOTE — H&P (Signed)
History and Physical    Alison Mcguire IZT:[id number] DOB: March 26, 1941 DOA: 08/29/2017  PCP: Leanna Battles, MD  Patient coming from: Home  I have personally briefly reviewed patient's old medical records in [place]  Chief Complaint: TIA  HPI: Alison Mcguire is a 76 y.o. female with medical history significant of prior TIA some 30 years ago.  Patient presents to the ED with c/o transient changes to vision and L arm numbness.  This has been episodic and ongoing for the past 4 days.  Episodes last for a short period of time then resolve completely.  Has family history of stroke.   ED Course: CT head negative, given 1L IVF bolus   Review of Systems: As per HPI otherwise 10 point review of systems negative.   Past Medical History:  Diagnosis Date  . Acid reflux   . Anxiety   . Depression   . GI bleed   . Hyperlipidemia     Past Surgical History:  Procedure Laterality Date  . ABDOMINAL HYSTERECTOMY    . BREAST SURGERY       reports that she has never smoked. She has never used smokeless tobacco. She reports that she does not drink alcohol or use drugs.  Allergies  Allergen Reactions  . Codeine Shortness Of Breath and Rash  . Aspirin     bleeding  . Clinoril [Sulindac] Other (See Comments)    Unknown   . Demerol [Meperidine] Other (See Comments)    Unknown   . Dilaudid [Hydromorphone Hcl] Nausea And Vomiting  . Ether Other (See Comments)    Passed out   . Soma [Carisoprodol] Other (See Comments)    Migraines   . Betadine [Povidone Iodine] Rash  . Nickel Rash and Other (See Comments)    Family History  Problem Relation Age of Onset  . Hypertension Sister   . Diabetes Son   . Stroke Sister   . Diabetes Sister      Prior to Admission medications   Medication Sig Start Date End Date Taking? Authorizing Provider  calcium-vitamin D 250-100 MG-UNIT per tablet Take 1 tablet by mouth 2 (two) times daily.    [provider]  cetirizine  (ZYRTEC) 10 MG tablet Take 10 mg by mouth daily.    [provider]  GLUCOS-CHONDROIT-HYALURON-MSM PO Take 1 tablet by mouth daily.    [provider]  meclizine (ANTIVERT) 25 MG tablet Take until 24 hours without dizziness 04/07/15   Tanna Furry, MD  minoxidil (LONITEN) 2.5 MG tablet Take 2.5 mg by mouth daily.    [provider]  Multiple Vitamins-Minerals (HAIR/SKIN/NAILS PO) Take 1 tablet by mouth 2 (two) times daily.    [provider]  Multiple Vitamins-Minerals (MULTIVITAMIN WITH MINERALS) tablet Take 1 tablet by mouth daily.    [provider]  omeprazole (PRILOSEC) 20 MG capsule Take 20 mg by mouth daily.    [provider]  ondansetron (ZOFRAN ODT) 4 MG disintegrating tablet Take 1 tablet (4 mg total) by mouth every 8 (eight) hours as needed for nausea. 04/07/15   Tanna Furry, MD  ondansetron (ZOFRAN ODT) 4 MG disintegrating tablet Take 1 tablet (4 mg total) by mouth every 8 (eight) hours as needed for nausea. 04/07/15   Tanna Furry, MD  pantoprazole (PROTONIX) 40 MG tablet Take 1 tablet (40 mg total) by mouth 2 (two) times daily. 08/02/12 08/02/13  Crist Infante, MD  pravastatin (PRAVACHOL) 20 MG tablet Take 20 mg by mouth daily.  [provider]  sucralfate (CARAFATE) 1 GM/10ML suspension Take 10 mLs (1 g total) by mouth every 6 (six) hours. 08/02/12 09/01/12  Crist Infante, MD  Vitamin D, Ergocalciferol, (DRISDOL) 50000 UNITS CAPS Take 50,000 Units by mouth every 7 (seven) days. Every Wednesday    [provider]    Physical Exam: Vitals:   08/29/17 1923 08/29/17 2115 08/30/17 0030 08/30/17 0100  BP: (!) 153/93 (!) 142/88 140/77 135/74  Pulse: 93 88 67 80  Resp: 16 18 13  (!) 24  Temp: 98.1 F (36.7 C)     TempSrc: Oral     SpO2: 98% 97% 96% 99%    Constitutional: NAD, calm, comfortable Eyes: PERRL, lids and conjunctivae normal ENMT: Mucous membranes are moist. Posterior pharynx clear of any exudate or  lesions.Normal dentition.  Neck: normal, supple, no masses, no thyromegaly Respiratory: clear to auscultation bilaterally, no wheezing, no crackles. Normal respiratory effort. No accessory muscle use.  Cardiovascular: Regular rate and rhythm, no murmurs / rubs / gallops. No extremity edema. 2+ pedal pulses. No carotid bruits.  Abdomen: no tenderness, no masses palpated. No hepatosplenomegaly. Bowel sounds positive.  Musculoskeletal: no clubbing / cyanosis. No joint deformity upper and lower extremities. Good ROM, no contractures. Normal muscle tone.  Skin: no rashes, lesions, ulcers. No induration Neurologic: CN 2-12 grossly intact. Sensation intact, DTR normal. Strength 5/5 in all 4.  Psychiatric: Normal judgment and insight. Alert and oriented x 3. Normal mood.    Labs on Admission: I have personally reviewed following labs and imaging studies  CBC:  Recent Labs Lab 08/29/17 1921 08/29/17 1941  WBC 5.4  --   NEUTROABS 2.4  --   HGB 14.5 15.0  HCT 44.8 44.0  MCV 89.6  --   PLT 254  --    Basic Metabolic Panel:  Recent Labs Lab 08/29/17 1921 08/29/17 1941  NA 141 143  K 4.0 4.1  CL 104 104  CO2 27  --   GLUCOSE 122* 112*  BUN 15 18  CREATININE 0.72 0.70  CALCIUM 9.5  --    GFR: CrCl cannot be calculated (Unknown ideal weight.). Liver Function Tests:  Recent Labs Lab 08/29/17 1921  AST 23  ALT 17  ALKPHOS 66  BILITOT 0.6  PROT 7.3  ALBUMIN 4.3   No results for input(s): LIPASE, AMYLASE in the last 168 hours. No results for input(s): AMMONIA in the last 168 hours. Coagulation Profile:  Recent Labs Lab 08/29/17 1921  INR 0.92   Cardiac Enzymes: No results for input(s): CKTOTAL, CKMB, CKMBINDEX, TROPONINI in the last 168 hours. BNP (last 3 results) No results for input(s): PROBNP in the last 8760 hours. HbA1C: No results for input(s): HGBA1C in the last 72 hours. CBG:  Recent Labs Lab 08/29/17 1919  GLUCAP 92   Lipid Profile: No results for  input(s): CHOL, HDL, LDLCALC, TRIG, CHOLHDL, LDLDIRECT in the last 72 hours. Thyroid Function Tests: No results for input(s): TSH, T4TOTAL, FREET4, T3FREE, THYROIDAB in the last 72 hours. Anemia Panel: No results for input(s): VITAMINB12, FOLATE, FERRITIN, TIBC, IRON, RETICCTPCT in the last 72 hours. Urine analysis:    Component Value Date/Time   COLORURINE AMBER (A) 07/28/2012 1211   APPEARANCEUR CLOUDY (A) 07/28/2012 1211   LABSPEC 1.030 07/28/2012 1211   PHURINE 5.5 07/28/2012 1211   GLUCOSEU NEGATIVE 07/28/2012 1211   HGBUR NEGATIVE 07/28/2012 1211   BILIRUBINUR SMALL (A) 07/28/2012 1211   KETONESUR NEGATIVE 07/28/2012 1211   PROTEINUR 30 (A) 07/28/2012 1211  UROBILINOGEN 1.0 07/28/2012 1211   NITRITE POSITIVE (A) 07/28/2012 1211   LEUKOCYTESUR SMALL (A) 07/28/2012 1211    Radiological Exams on Admission: Dg Chest 2 View  Result Date: 08/30/2017 CLINICAL DATA:  Weakness.  Blacking out episodes with diaphoresis. EXAM: CHEST  2 VIEW COMPARISON:  02/14/2005 FINDINGS: Normal heart size and pulmonary vascularity. Chronic blunting of left costophrenic angle may represent thickened pleura. No airspace disease or consolidation in the lungs. No pneumothorax. Mediastinal contours appear intact. Surgical clips at the EG junction. IMPRESSION: No evidence of active pulmonary disease. Probable pleural thickening in the left costophrenic angle. Electronically Signed   By: Lucienne Capers M.D.   On: 08/30/2017 00:16   Ct Head Wo Contrast  Result Date: 08/29/2017 CLINICAL DATA:  Right temporal pain with generalized weakness. Blacking out episodes and diaphoresis. EXAM: CT HEAD WITHOUT CONTRAST TECHNIQUE: Contiguous axial images were obtained from the base of the skull through the vertex without intravenous contrast. COMPARISON:  April 07, 2015 FINDINGS: Brain: No evidence of acute infarction, hemorrhage, hydrocephalus, extra-axial collection or mass lesion/mass effect. Vascular: No hyperdense vessel  or unexpected calcification. Skull: Normal. Negative for fracture or focal lesion. Sinuses/Orbits: No acute finding. Other: None. IMPRESSION: No acute intracranial abnormalities identified. Electronically Signed   By: Dorise Bullion III M.D   On: 08/29/2017 20:00    EKG: Independently reviewed.  Assessment/Plan Principal Problem:   TIA (transient ischemic attack)    1. TIA - 1. TIA pathway 2. MRI brain 3. 2d echo 4. Carotid dopplers 5. A1C and lipid profile 6. Neuro eval 7. Tele monitor 8. Will hold off on starting anti-platelet agent for now given ASA in allergy list with h/o "bleeding".  DVT prophylaxis: Lovenox Code Status: Full Family Communication: Family at bedside Disposition Plan: Home after admit Consults called: Neuro Admission status: Place in obs   Ethaniel Garfield, [place] Pager [phone]  If 7AM-7PM, please contact day team taking care of patient [email or web address] Password [email or web address]  08/30/2017, 1:21 AM

## 2017-08-30 NOTE — Progress Notes (Signed)
  Echocardiogram 2D Echocardiogram has been performed.  Quran Vasco 08/30/2017, 3:08 PM

## 2017-08-30 NOTE — Progress Notes (Signed)
**  Preliminary report by tech**  Carotid artery duplex complete. Findings are consistent with a 1-39 percent stenosis involving the right internal carotid artery and the left internal carotid artery. The vertebral arteries demonstrate antegrade flow.  08/30/17 4:50 PM Alison Mcguire RVT

## 2017-08-31 DIAGNOSIS — G43109 Migraine with aura, not intractable, without status migrainosus: Principal | ICD-10-CM

## 2017-08-31 DIAGNOSIS — N3281 Overactive bladder: Secondary | ICD-10-CM

## 2017-08-31 DIAGNOSIS — L659 Nonscarring hair loss, unspecified: Secondary | ICD-10-CM

## 2017-08-31 DIAGNOSIS — R200 Anesthesia of skin: Secondary | ICD-10-CM

## 2017-08-31 MED ORDER — MIRABEGRON ER 25 MG PO TB24
25.0000 mg | ORAL_TABLET | Freq: Every day | ORAL | Status: DC
  Administered 2017-08-31: 25 mg via ORAL
  Filled 2017-08-31: qty 1

## 2017-08-31 MED ORDER — TOPIRAMATE 25 MG PO TABS: 25.0000 mg | ORAL_TABLET | Freq: Every day | ORAL | 2 refills | Status: DC

## 2017-08-31 MED ORDER — ONDANSETRON HCL 4 MG PO TABS
4.0000 mg | ORAL_TABLET | Freq: Once | ORAL | Status: AC
  Administered 2017-08-31: 4 mg via ORAL
  Filled 2017-08-31: qty 1

## 2017-08-31 MED ORDER — TOPIRAMATE 25 MG PO TABS
25.0000 mg | ORAL_TABLET | Freq: Every day | ORAL | 2 refills | Status: DC
Start: 2017-08-31 — End: 2017-08-31

## 2017-08-31 NOTE — Progress Notes (Signed)
Pt d/c home, no new concerns . Pt is alert and oriented. D/c instructions done with teach back, pt verbalize understanding.

## 2017-08-31 NOTE — Care Management Note (Signed)
Case Management Note  Patient Details  Name: [name] MRN: [id number] Date of Birth: 1941-07-04  Subjective/Objective:                 Patient with order to DC to home today. Chart reviewed. No Home Health or Equipment needs, no unacknowledged Case Management consults or medication needs identified at the time of this note. Plan for DC to home. If needs arise today prior to discharge, please call Carles Collet RN CM at [phone].    Action/Plan:   Expected Discharge Date:  08/31/17               Expected Discharge Plan:  Home/Self Care  In-House Referral:     Discharge planning Services  CM Consult  Post Acute Care Choice:    Choice offered to:     DME Arranged:    DME Agency:     HH Arranged:    HH Agency:     Status of Service:  Completed, signed off  If discussed at [employer] of Stay Meetings, dates discussed:    Additional Comments:  Carles Collet, RN 08/31/2017, 8:38 AM

## 2017-08-31 NOTE — Progress Notes (Signed)
Subjective Patient seen and examined. Complains of slight heaviness in thead. No focal neurological symptoms.  Objective Vitals:   08/31/17 0200 08/31/17 0605  BP: 103/60 (!) 91/45  Pulse: 76 75  Resp: 18 18  Temp: 98 F (36.7 C) 97.9 F (36.6 C)  SpO2: 96% 96%  GENERAL: Awake, alert in NAD HEENT: - Normocephalic and atraumatic, dry mm, no LN++, no Thyromegally LUNGS - Clear to auscultation bilaterally with no wheezes CV - S1S2 RRR, no m/r/g, equal pulses bilaterally. ABDOMEN - Soft, nontender, nondistended with normoactive BS  NEURO:  Mental Status: AA&Ox3 Language: speech is clear  Naming, repetition, fluency, and comprehension intact. Cranial Nerves: PERRL 79mm/brisk. EOMI, visual fields full, no facial asymmetry, facial sensation intact, hearing intact, tongue/uvula/soft palate midline, normal sternocleidomastoid and trapezius muscle strength. No evidence of tongue atrophy or fibrillations Motor: 5/5 b/l Tone: is normal and bulk is normal Sensation- Intact to light touch bilaterally Coordination: FTN intact bilaterally, no ataxia in BLE. Gait- deferred Scheduled Meds: . ALPRAZolam  0.5 mg Oral QHS  . clopidogrel  75 mg Oral Daily  . enoxaparin (LOVENOX) injection  40 mg Subcutaneous Q24H  . loratadine  10 mg Oral Daily  . minoxidil  2.5 mg Oral QHS  . mirabegron ER  25 mg Oral Daily  . multivitamin  1 tablet Oral Daily  . pantoprazole  40 mg Oral Daily  . pravastatin  20 mg Oral QHS  . [START ON 09/04/2017] Vitamin D (Ergocalciferol)  50,000 Units Oral Q Wed   Continuous Infusions: PRN Meds:.acetaminophen **OR** acetaminophen (TYLENOL) oral liquid 160 mg/5 mL **OR** acetaminophen, senna-docusate  Labs LDL 73 Echo - normal LVEF, stg 1 diastolic dysfunction  MRI reviewed. No acute changes on brain mri or mra.  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 Stroke unlikely with negative MRI  Rec: -Can consider topamax for headache prophylaxis as she has h/o migraines as a younger adult.  (Topamax 25mg  qhs for 1 week, followed by 25 mg BID for 1 week, followed by 25mg  qAM and 50 mg qhs for one week followed by goal dose of 50 mg BID.) Discussed side effects in detail including word finding difficulties and weight loss. Counseled on maintaining adequate hydrration especially while on topamax to reduce chances of developing renal calculi, which is also a known side effect.  -She is on Minoxidil for hair loss. Would recommend d/c Minoxidil as it can cause hypotension and exacerbate symptoms of hedache.  -c/w antiplatelet and statin  -f/u with outpatient neurology 4 weeks -f/u with PCP as planned later this month  We will be available as needed. Please call with questions.  Amie Portland, MD [facility] [phone]  If 7pm to 7am, please call on call as listed on AMION.

## 2017-08-31 NOTE — Discharge Summary (Signed)
Physician Discharge Summary  Alison Mcguire QQP:[id number] DOB: Jul 13, 1941 DOA: 08/29/2017  PCP: Leanna Battles, MD  Admit date: 08/29/2017 Discharge date: 08/31/2017  Admitted From: home Disposition:  home   Recommendations for Outpatient Follow-up:  1. F.u with Neuro in 1 month  Discharge Condition:  stable   CODE STATUS:  Full code   Consultations:  Neuro    Discharge Diagnoses:  Principal Problem:   Complicated migraine Active Problems:   Overactive bladder   Hair loss    Subjective: No complaints.   Brief Summary:  Alison Mcguire is a 76 y.o. female with medical history significant of prior TIA, anxiety, HLD, severe migraines, .  Patient presents to the ED with c/o transient changes to vision and L arm numbness.  This has been episodic and ongoing for the past 4 days.  Episodes last for a short period of time then resolve completely.  Hospital Course:  Numbness L arm - determined to be complex migraine- symptoms resolved with Reglan/Toradol - neuro recommends starting 25 mg Topamax daily and increasing by 25 mg weekly until a dose of 50 mg BID is reached and f.u with Neuro in 4 wks - the following work up has been done: - EEG- normal - Carotid duplex:Findings are consistent with a 1-39 percent stenosis involving the right internal carotid artery and the left internal carotid artery. The vertebral arteries demonstrate antegrade flow - 2 D ECHO- grade 1 d CHF - imaging is below  Hair loss - BP has been in low 100s and 90s over the past few days with Minoxidil- she should d/c it and can use topical Minoxidil instead   Discharge Instructions  Discharge Instructions    Ambulatory referral to Neurology    Complete by:  As directed    F/u with any provider for Complicated Migraines   Diet - low sodium heart healthy    Complete by:  As directed    Increase activity slowly    Complete by:  As directed      Allergies as of 08/31/2017      Reactions    Codeine Shortness Of Breath, Rash   Aspirin Other (See Comments)   bleeding   Clinoril [sulindac] Other (See Comments)   Unknown    Demerol [meperidine] Other (See Comments)   Unknown    Dilaudid [hydromorphone Hcl] Nausea And Vomiting   Ether Other (See Comments)   Passed out    [employer] [carisoprodol] Other (See Comments)   Migraines    Betadine [povidone Iodine] Rash   Nickel Rash, Other (See Comments)      Medication List    STOP taking these medications   minoxidil 2.5 MG tablet Commonly known as:  LONITEN   pantoprazole 40 MG tablet Commonly known as:  PROTONIX     TAKE these medications   ALPRAZolam 0.5 MG tablet Commonly known as:  XANAX Take 0.5 mg by mouth at bedtime.   Biotin 5000 MCG Caps Take 1 capsule by mouth at bedtime.   calcium carbonate 1500 (600 Ca) MG Tabs tablet Commonly known as:  OSCAL Take by mouth 2 (two) times daily with a meal.   cetirizine 10 MG tablet Commonly known as:  ZYRTEC Take 10 mg by mouth daily.   GLUCOS-CHONDROIT-HYALURON-MSM PO Take 1 tablet by mouth daily.   HAIR/SKIN/NAILS PO Take 1 tablet by mouth 2 (two) times daily.   multivitamin-lutein Caps capsule Take 1 capsule by mouth daily.   multivitamin with minerals tablet Take 1 tablet  by mouth daily.   MYRBETRIQ 25 MG Tb24 tablet Generic drug:  mirabegron ER Take 25 mg by mouth daily.   omeprazole 20 MG capsule Commonly known as:  PRILOSEC Take 20 mg by mouth daily.   pravastatin 20 MG tablet Commonly known as:  PRAVACHOL Take 20 mg by mouth at bedtime.   sucralfate 1 GM/10ML suspension Commonly known as:  CARAFATE Take 10 mLs (1 g total) by mouth every 6 (six) hours.   topiramate 25 MG tablet Commonly known as:  TOPAMAX Take 1 tablet (25 mg total) by mouth at bedtime. Start with 25 mg at bedtime for 1 wk In 7 days, increase to 25 mg BID for In 7 days, increase to 25 mg in AM and 50 mg at bedtime In 7 days, increase to 50 mg BID   Vitamin D (Ergocalciferol)  50000 units Caps capsule Commonly known as:  DRISDOL Take 50,000 Units by mouth every 7 (seven) days. Every Wednesday            Discharge Care Instructions        Start     Ordered   08/31/17 0000  Ambulatory referral to Neurology    Comments:  F/u with any provider for Complicated Migraines   03/50/09 0822   08/31/17 0000  Increase activity slowly     08/31/17 0827   08/31/17 0000  Diet - low sodium heart healthy     08/31/17 0827   08/31/17 0000  topiramate (TOPAMAX) 25 MG tablet  Daily at bedtime     08/31/17 0830      Allergies  Allergen Reactions  . Codeine Shortness Of Breath and Rash  . Aspirin Other (See Comments)    bleeding  . Clinoril [Sulindac] Other (See Comments)    Unknown   . Demerol [Meperidine] Other (See Comments)    Unknown   . Dilaudid [Hydromorphone Hcl] Nausea And Vomiting  . Ether Other (See Comments)    Passed out   . Soma [Carisoprodol] Other (See Comments)    Migraines   . Betadine [Povidone Iodine] Rash  . Nickel Rash and Other (See Comments)     Procedures/Studies: ECHO: Left ventricle: The cavity size was normal. Wall thickness was   normal. Systolic function was normal. The estimated ejection   fraction was in the range of 60% to 65%. Wall motion was normal;   there were no regional wall motion abnormalities. Doppler   parameters are consistent with abnormal left ventricular   relaxation (grade 1 diastolic dysfunction).  Dg Chest 2 View  Result Date: 08/30/2017 CLINICAL DATA:  Weakness.  Blacking out episodes with diaphoresis. EXAM: CHEST  2 VIEW COMPARISON:  02/14/2005 FINDINGS: Normal heart size and pulmonary vascularity. Chronic blunting of left costophrenic angle may represent thickened pleura. No airspace disease or consolidation in the lungs. No pneumothorax. Mediastinal contours appear intact. Surgical clips at the EG junction. IMPRESSION: No evidence of active pulmonary disease. Probable pleural thickening in the left  costophrenic angle. Electronically Signed   By: Lucienne Capers M.D.   On: 08/30/2017 00:16   Ct Head Wo Contrast  Result Date: 08/29/2017 CLINICAL DATA:  Right temporal pain with generalized weakness. Blacking out episodes and diaphoresis. EXAM: CT HEAD WITHOUT CONTRAST TECHNIQUE: Contiguous axial images were obtained from the base of the skull through the vertex without intravenous contrast. COMPARISON:  April 07, 2015 FINDINGS: Brain: No evidence of acute infarction, hemorrhage, hydrocephalus, extra-axial collection or mass lesion/mass effect. Vascular: No hyperdense vessel or unexpected  calcification. Skull: Normal. Negative for fracture or focal lesion. Sinuses/Orbits: No acute finding. Other: None. IMPRESSION: No acute intracranial abnormalities identified. Electronically Signed   By: Dorise Bullion III M.D   On: 08/29/2017 20:00   Mr Jodene Nam Head Wo Contrast  Result Date: 08/30/2017 CLINICAL DATA:  Acute presentation with visual disturbance and left arm numbness over the last 4 days. EXAM: MRI HEAD WITHOUT CONTRAST MRA HEAD WITHOUT CONTRAST TECHNIQUE: Multiplanar, multiecho pulse sequences of the brain and surrounding structures were obtained without intravenous contrast. Angiographic images of the head were obtained using MRA technique without contrast. COMPARISON:  CT 08/29/2017 FINDINGS: MRI HEAD FINDINGS Brain: Diffusion imaging does not show any acute or subacute infarction. The brainstem and cerebellum are normal. Cerebral hemispheres are normal. No evidence of old infarction. No mass lesion, hemorrhage, hydrocephalus or extra-axial collection. Vascular: Major vessels at the base of the brain show flow. Skull and upper cervical spine: Negative Sinuses/Orbits: Clear/ normal.  Previous lens implant on the left. Other: None MRA HEAD FINDINGS Both internal carotid arteries are widely patent into the brain. No siphon stenosis. The anterior and middle cerebral vessels are patent without proximal stenosis,  aneurysm or vascular malformation. Both vertebral arteries are widely patent to the basilar. No basilar stenosis. Posterior circulation branch vessels appear normal. IMPRESSION: Normal MRI of the brain, unusual at this age. No evidence of atrophy or any previous large or small vessel insult. Normal intracranial MR angiography. Electronically Signed   By: Nelson Chimes M.D.   On: 08/30/2017 08:55   Mr Brain Wo Contrast  Result Date: 08/30/2017 CLINICAL DATA:  Acute presentation with visual disturbance and left arm numbness over the last 4 days. EXAM: MRI HEAD WITHOUT CONTRAST MRA HEAD WITHOUT CONTRAST TECHNIQUE: Multiplanar, multiecho pulse sequences of the brain and surrounding structures were obtained without intravenous contrast. Angiographic images of the head were obtained using MRA technique without contrast. COMPARISON:  CT 08/29/2017 FINDINGS: MRI HEAD FINDINGS Brain: Diffusion imaging does not show any acute or subacute infarction. The brainstem and cerebellum are normal. Cerebral hemispheres are normal. No evidence of old infarction. No mass lesion, hemorrhage, hydrocephalus or extra-axial collection. Vascular: Major vessels at the base of the brain show flow. Skull and upper cervical spine: Negative Sinuses/Orbits: Clear/ normal.  Previous lens implant on the left. Other: None MRA HEAD FINDINGS Both internal carotid arteries are widely patent into the brain. No siphon stenosis. The anterior and middle cerebral vessels are patent without proximal stenosis, aneurysm or vascular malformation. Both vertebral arteries are widely patent to the basilar. No basilar stenosis. Posterior circulation branch vessels appear normal. IMPRESSION: Normal MRI of the brain, unusual at this age. No evidence of atrophy or any previous large or small vessel insult. Normal intracranial MR angiography. Electronically Signed   By: Nelson Chimes M.D.   On: 08/30/2017 08:55       Discharge Exam: Vitals:   08/31/17 0200  08/31/17 0605  BP: 103/60 (!) 91/45  Pulse: 76 75  Resp: 18 18  Temp: 98 F (36.7 C) 97.9 F (36.6 C)  SpO2: 96% 96%   Vitals:   08/30/17 1735 08/30/17 2045 08/31/17 0200 08/31/17 0605  BP: 119/69 127/75 103/60 (!) 91/45  Pulse: 97 78 76 75  Resp: 18 20 18 18   Temp: 98.7 F (37.1 C) 98.3 F (36.8 C) 98 F (36.7 C) 97.9 F (36.6 C)  TempSrc: Oral Oral Oral Oral  SpO2: 96% 99% 96% 96%  Weight:      Height:  General: Pt is alert, awake, not in acute distress Cardiovascular: RRR, S1/S2 +, no rubs, no gallops Respiratory: CTA bilaterally, no wheezing, no rhonchi Abdominal: Soft, NT, ND, bowel sounds + Extremities: no edema, no cyanosis    The results of significant diagnostics from this hospitalization (including imaging, microbiology, ancillary and laboratory) are listed below for reference.     Microbiology: No results found for this or any previous visit (from the past 240 hour(s)).   Labs: BNP (last 3 results) No results for input(s): BNP in the last 8760 hours. Basic Metabolic Panel:  Recent Labs Lab 08/29/17 1921 08/29/17 1941  NA 141 143  K 4.0 4.1  CL 104 104  CO2 27  --   GLUCOSE 122* 112*  BUN 15 18  CREATININE 0.72 0.70  CALCIUM 9.5  --    Liver Function Tests:  Recent Labs Lab 08/29/17 1921  AST 23  ALT 17  ALKPHOS 66  BILITOT 0.6  PROT 7.3  ALBUMIN 4.3   No results for input(s): LIPASE, AMYLASE in the last 168 hours. No results for input(s): AMMONIA in the last 168 hours. CBC:  Recent Labs Lab 08/29/17 1921 08/29/17 1941  WBC 5.4  --   NEUTROABS 2.4  --   HGB 14.5 15.0  HCT 44.8 44.0  MCV 89.6  --   PLT 254  --    Cardiac Enzymes: No results for input(s): CKTOTAL, CKMB, CKMBINDEX, TROPONINI in the last 168 hours. BNP: Invalid input(s): POCBNP CBG:  Recent Labs Lab 08/29/17 1919  GLUCAP 92   D-Dimer No results for input(s): DDIMER in the last 72 hours. Hgb A1c  Recent Labs  08/30/17 0543  HGBA1C 5.9*    Lipid Profile  Recent Labs  08/30/17 0543  CHOL 156  HDL 44  LDLCALC 73  TRIG 193*  CHOLHDL 3.5   Thyroid function studies No results for input(s): TSH, T4TOTAL, T3FREE, THYROIDAB in the last 72 hours.  Invalid input(s): FREET3 Anemia work up No results for input(s): VITAMINB12, FOLATE, FERRITIN, TIBC, IRON, RETICCTPCT in the last 72 hours. Urinalysis    Component Value Date/Time   COLORURINE YELLOW 08/30/2017 0020   APPEARANCEUR HAZY (A) 08/30/2017 0020   LABSPEC 1.010 08/30/2017 0020   PHURINE 5.0 08/30/2017 0020   GLUCOSEU NEGATIVE 08/30/2017 0020   HGBUR SMALL (A) 08/30/2017 0020   BILIRUBINUR NEGATIVE 08/30/2017 0020   KETONESUR NEGATIVE 08/30/2017 0020   PROTEINUR NEGATIVE 08/30/2017 0020   UROBILINOGEN 1.0 07/28/2012 1211   NITRITE NEGATIVE 08/30/2017 0020   LEUKOCYTESUR MODERATE (A) 08/30/2017 0020   Sepsis Labs Invalid input(s): PROCALCITONIN,  WBC,  LACTICIDVEN Microbiology No results found for this or any previous visit (from the past 240 hour(s)).   Time coordinating discharge: Over 30 minutes  SIGNED:   Debbe Odea, MD  [facility] 08/31/2017, 8:32 AM Pager   If 7PM-7AM, please contact night-coverage [email or web address] Password [email or web address]

## 2017-09-01 LAB — VAS US CAROTID
LCCADDIAS: -23 cm/s
LCCADSYS: -84 cm/s
LCCAPDIAS: 21 cm/s
LEFT ECA DIAS: -15 cm/s
LICADDIAS: -18 cm/s
LICADSYS: -53 cm/s
LICAPDIAS: -17 cm/s
LICAPSYS: -49 cm/s
Left CCA prox sys: 85 cm/s
RCCAPSYS: -89 cm/s
RIGHT ECA DIAS: -10 cm/s
RIGHT VERTEBRAL DIAS: -14 cm/s
Right CCA prox dias: -18 cm/s
Right cca dist sys: -75 cm/s

## 2017-09-01 LAB — URINE CULTURE: Culture: >=100000 — AB

## 2017-09-20 DIAGNOSIS — M542 Cervicalgia: Secondary | ICD-10-CM | POA: Diagnosis not present

## 2017-09-20 DIAGNOSIS — Z0000 Encounter for general adult medical examination without abnormal findings: Secondary | ICD-10-CM | POA: Diagnosis not present

## 2017-09-20 DIAGNOSIS — K219 Gastro-esophageal reflux disease without esophagitis: Secondary | ICD-10-CM | POA: Diagnosis not present

## 2017-09-20 DIAGNOSIS — Z23 Encounter for immunization: Secondary | ICD-10-CM | POA: Diagnosis not present

## 2017-09-20 DIAGNOSIS — E784 Other hyperlipidemia: Secondary | ICD-10-CM | POA: Diagnosis not present

## 2017-09-20 DIAGNOSIS — F5104 Psychophysiologic insomnia: Secondary | ICD-10-CM | POA: Diagnosis not present

## 2017-09-20 DIAGNOSIS — G43809 Other migraine, not intractable, without status migrainosus: Secondary | ICD-10-CM | POA: Diagnosis not present

## 2017-09-20 DIAGNOSIS — F3289 Other specified depressive episodes: Secondary | ICD-10-CM | POA: Diagnosis not present

## 2017-09-20 DIAGNOSIS — R7309 Other abnormal glucose: Secondary | ICD-10-CM | POA: Diagnosis not present

## 2017-09-25 DIAGNOSIS — Z01419 Encounter for gynecological examination (general) (routine) without abnormal findings: Secondary | ICD-10-CM | POA: Diagnosis not present

## 2017-09-25 DIAGNOSIS — Z780 Asymptomatic menopausal state: Secondary | ICD-10-CM | POA: Diagnosis not present

## 2017-09-25 DIAGNOSIS — Z6827 Body mass index (BMI) 27.0-27.9, adult: Secondary | ICD-10-CM | POA: Diagnosis not present

## 2017-09-29 DIAGNOSIS — E7849 Other hyperlipidemia: Secondary | ICD-10-CM | POA: Diagnosis not present

## 2017-09-29 DIAGNOSIS — K219 Gastro-esophageal reflux disease without esophagitis: Secondary | ICD-10-CM | POA: Diagnosis not present

## 2017-09-29 DIAGNOSIS — R110 Nausea: Secondary | ICD-10-CM | POA: Diagnosis not present

## 2017-09-29 DIAGNOSIS — G43809 Other migraine, not intractable, without status migrainosus: Secondary | ICD-10-CM | POA: Diagnosis not present

## 2017-09-29 DIAGNOSIS — R202 Paresthesia of skin: Secondary | ICD-10-CM | POA: Diagnosis not present

## 2017-09-29 DIAGNOSIS — Z6826 Body mass index (BMI) 26.0-26.9, adult: Secondary | ICD-10-CM | POA: Diagnosis not present

## 2017-10-24 ENCOUNTER — Encounter: Payer: Self-pay | Admitting: Neurology

## 2017-10-24 ENCOUNTER — Ambulatory Visit (INDEPENDENT_AMBULATORY_CARE_PROVIDER_SITE_OTHER): Payer: Medicare HMO | Admitting: Neurology

## 2017-10-24 VITALS — BP 152/92 | HR 84 | Ht 64.0 in | Wt 151.0 lb

## 2017-10-24 DIAGNOSIS — G43109 Migraine with aura, not intractable, without status migrainosus: Secondary | ICD-10-CM | POA: Diagnosis not present

## 2017-10-24 NOTE — Progress Notes (Signed)
Reason for visit: Migraine headache  Referring physician: [facility]  Alison Mcguire is a 76 y.o. female  History of present illness:  Alison Mcguire is a 76 year old right-handed white female with a history of migraine headache as a child that began around the age of 41.  The patient had severe migraine headaches when she was a child, adolescent, and young adult.  Since 1983, she has not had any migraine.  Beginning around 26 August 2017, the patient began having some visual alterations that would come and go.  On 30 August 2017, the patient began having some worsening vision changes, difficulty with focusing, and then onset of numbness in the left arm from elbow to the hand.  The patient did not have any weakness, facial asymmetry, speech changes, or difficulty with balance.  The patient went to the hospital and underwent a stroke workup.  As the numbness of the left arm improved and the vision improved, she began getting a right temporal headache.  The patient felt somewhat cloudy headed during this event.  The patient underwent a stroke evaluation that revealed a normal MRI of the brain, MRI angiogram of the head, carotid Doppler study, and a normal EEG study.  The patient indicates that she cannot take aspirin or Plavix.  She has not had any recurrence of the above symptoms.  She was felt to have a neurologic migraine, she was placed on Topamax but this resulted in significant side effects and she had to stop the drug.  The patient still feels somewhat spacey, she feels that she cannot focus well.  Bright lights do bother her, but this has been a chronic issue.  She has difficulty focusing on a TV screen or a computer screen.  The patient returns to the office today for an evaluation. She has been under a lot of stress recently.  Past Medical History:  Diagnosis Date  . Acid reflux   . Anxiety   . Depression   . GI bleed   . Hyperlipidemia     Past Surgical History:  Procedure  Laterality Date  . ABDOMINAL HYSTERECTOMY    . BREAST SURGERY      Family History  Problem Relation Age of Onset  . Hypertension Sister   . Diabetes Son   . Stroke Sister   . Diabetes Sister     Social history:  reports that she has never smoked. She has never used smokeless tobacco. She reports that she does not drink alcohol or use drugs.  Medications:  Prior to Admission medications   Medication Sig Start Date End Date Taking? Authorizing Provider  ALPRAZolam Duanne Moron) 0.5 MG tablet Take 1 mg by mouth at bedtime.    Yes [provider]  Biotin 5000 MCG CAPS Take 1 capsule by mouth at bedtime.   Yes [provider]  BIOTIN W/ VITAMINS C & E PO Take 1 Dose by mouth daily.   Yes [provider]  calcium carbonate (OSCAL) 1500 (600 Ca) MG TABS tablet Take by mouth 2 (two) times daily with a meal.   Yes [provider]  cetirizine (ZYRTEC) 10 MG tablet Take 10 mg by mouth daily.   Yes [provider]  GLUCOS-CHONDROIT-HYALURON-MSM PO Take 1 tablet by mouth 3 (three) times daily.    Yes [provider]  meloxicam (MOBIC) 15 MG tablet Take 15 mg by mouth daily.   Yes [provider]  Multiple Vitamins-Minerals (HAIR/SKIN/NAILS PO) Take 1 tablet by mouth 2 (  two) times daily.   Yes [provider]  Multiple Vitamins-Minerals (MULTIVITAMIN WITH MINERALS) tablet Take 1 tablet by mouth daily.   Yes [provider]  multivitamin-lutein (OCUVITE-LUTEIN) CAPS capsule Take 1 capsule by mouth daily.   Yes [provider]  MYRBETRIQ 25 MG TB24 tablet Take 50 mg by mouth daily.  07/26/17  Yes [provider]  omeprazole (PRILOSEC) 20 MG capsule Take 20 mg by mouth daily.   Yes [provider]  Vitamin D, Ergocalciferol, (DRISDOL) 50000 UNITS CAPS Take 50,000 Units by mouth every 7 (seven) days. Every Wednesday   Yes [provider]      Allergies  Allergen Reactions  . Codeine  Shortness Of Breath and Rash  . Aspirin Other (See Comments)    bleeding  . Clinoril [Sulindac] Other (See Comments)    Unknown   . Demerol [Meperidine] Other (See Comments)    Unknown   . Dilaudid [Hydromorphone Hcl] Nausea And Vomiting  . Ether Other (See Comments)    Passed out   . Soma [Carisoprodol] Other (See Comments)    Migraines   . Betadine [Povidone Iodine] Rash  . Nickel Rash and Other (See Comments)    ROS:  Out of a complete 14 system review of symptoms, the patient complains only of the following symptoms, and all other reviewed systems are negative.  Urinary incontinence Allergies  Blood pressure (!) 152/92, pulse 84, height 5\' 4"  (1.626 m), weight 151 lb (68.5 kg), last menstrual period 07/28/2012.  Physical Exam  General: The patient is alert and cooperative at the time of the examination.  Eyes: Pupils are equal, round, and reactive to light. Discs are flat bilaterally.  Good venous pulsations were seen bilaterally.  Neck: The neck is supple, no carotid bruits are noted.  Respiratory: The respiratory examination is clear.  Cardiovascular: The cardiovascular examination reveals a regular rate and rhythm, no obvious murmurs or rubs are noted.  Skin: Extremities are without significant edema.  Neurologic Exam  Mental status: The patient is alert and oriented x 3 at the time of the examination. The patient has apparent normal recent and remote memory, with an apparently normal attention span and concentration ability.  Cranial nerves: Facial symmetry is present. There is good sensation of the face to pinprick and soft touch bilaterally. The strength of the facial muscles and the muscles to head turning and shoulder shrug are normal bilaterally. Speech is well enunciated, no aphasia or dysarthria is noted. Extraocular movements are full. Visual fields are full. The tongue is midline, and the patient has symmetric elevation of the soft palate. No obvious  hearing deficits are noted.  Motor: The motor testing reveals 5 over 5 strength of all 4 extremities. Good symmetric motor tone is noted throughout.  Sensory: Sensory testing is intact to pinprick, soft touch, vibration sensation, and position sense on all 4 extremities. No evidence of extinction is noted.  Coordination: Cerebellar testing reveals good finger-nose-finger and heel-to-shin bilaterally.  Gait and station: Gait is normal. Tandem gait is normal. Romberg is negative. No drift is seen.  Reflexes: Deep tendon reflexes are symmetric and normal bilaterally. Toes are downgoing bilaterally.   MRI brain and MRA head 08/30/17:  IMPRESSION: Normal MRI of the brain, unusual at this age. No evidence of atrophy or any previous large or small vessel insult.  Normal intracranial MR angiography.  * MRI scan images were reviewed online. I agree with the written report.   EEG 08/30/17:  Impression: This  awake and asleep EEG is within normal limits.  Clinical Correlation: A normal EEG does not exclude a clinical diagnosis of epilepsy. Clinical correlation is advised.   Assessment/Plan:  1.  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  The patient still feels as if she is not quite back to normal.  There was no evidence of stroke by MRI of the brain.  The patient indicates that she cannot take antiplatelet medications secondary to bowel bleeding.  She has had a single event, I agree that she should not be on a daily medication at this time.  If the episodes recur, we will need to start another medication for migraine prophylaxis.  The patient will otherwise follow-up through this office if needed.  She will call me if there are any concerns.  Jill Alexanders MD 10/24/2017 2:06 PM  [facility] [address] [place], [place] [address]  Phone [phone] Fax [phone]

## 2018-03-19 DIAGNOSIS — E7849 Other hyperlipidemia: Secondary | ICD-10-CM | POA: Diagnosis not present

## 2018-03-21 DIAGNOSIS — E7849 Other hyperlipidemia: Secondary | ICD-10-CM | POA: Diagnosis not present

## 2018-03-21 DIAGNOSIS — N390 Urinary tract infection, site not specified: Secondary | ICD-10-CM | POA: Diagnosis not present

## 2018-03-21 DIAGNOSIS — R7309 Other abnormal glucose: Secondary | ICD-10-CM | POA: Diagnosis not present

## 2018-03-21 DIAGNOSIS — Z23 Encounter for immunization: Secondary | ICD-10-CM | POA: Diagnosis not present

## 2018-03-21 DIAGNOSIS — Z6826 Body mass index (BMI) 26.0-26.9, adult: Secondary | ICD-10-CM | POA: Diagnosis not present

## 2018-03-21 DIAGNOSIS — F5104 Psychophysiologic insomnia: Secondary | ICD-10-CM | POA: Diagnosis not present

## 2018-03-21 DIAGNOSIS — E119 Type 2 diabetes mellitus without complications: Secondary | ICD-10-CM | POA: Diagnosis not present

## 2018-03-21 DIAGNOSIS — R413 Other amnesia: Secondary | ICD-10-CM | POA: Diagnosis not present

## 2018-05-26 DIAGNOSIS — H2511 Age-related nuclear cataract, right eye: Secondary | ICD-10-CM | POA: Diagnosis not present

## 2018-06-18 DIAGNOSIS — H25811 Combined forms of age-related cataract, right eye: Secondary | ICD-10-CM | POA: Diagnosis not present

## 2018-06-18 DIAGNOSIS — H2511 Age-related nuclear cataract, right eye: Secondary | ICD-10-CM | POA: Diagnosis not present

## 2018-08-21 DIAGNOSIS — L648 Other androgenic alopecia: Secondary | ICD-10-CM | POA: Diagnosis not present

## 2018-08-21 DIAGNOSIS — L570 Actinic keratosis: Secondary | ICD-10-CM | POA: Diagnosis not present

## 2018-08-27 DIAGNOSIS — D649 Anemia, unspecified: Secondary | ICD-10-CM | POA: Diagnosis not present

## 2018-08-27 DIAGNOSIS — L658 Other specified nonscarring hair loss: Secondary | ICD-10-CM | POA: Diagnosis not present

## 2018-09-20 DIAGNOSIS — Z23 Encounter for immunization: Secondary | ICD-10-CM | POA: Diagnosis not present

## 2018-09-24 DIAGNOSIS — E7849 Other hyperlipidemia: Secondary | ICD-10-CM | POA: Diagnosis not present

## 2018-09-24 DIAGNOSIS — E119 Type 2 diabetes mellitus without complications: Secondary | ICD-10-CM | POA: Diagnosis not present

## 2018-09-24 DIAGNOSIS — D6489 Other specified anemias: Secondary | ICD-10-CM | POA: Diagnosis not present

## 2018-09-24 DIAGNOSIS — R82998 Other abnormal findings in urine: Secondary | ICD-10-CM | POA: Diagnosis not present

## 2018-10-01 DIAGNOSIS — L819 Disorder of pigmentation, unspecified: Secondary | ICD-10-CM | POA: Diagnosis not present

## 2018-10-01 DIAGNOSIS — L853 Xerosis cutis: Secondary | ICD-10-CM | POA: Diagnosis not present

## 2018-10-01 DIAGNOSIS — L638 Other alopecia areata: Secondary | ICD-10-CM | POA: Diagnosis not present

## 2018-10-03 DIAGNOSIS — Z6827 Body mass index (BMI) 27.0-27.9, adult: Secondary | ICD-10-CM | POA: Diagnosis not present

## 2018-10-03 DIAGNOSIS — R7309 Other abnormal glucose: Secondary | ICD-10-CM | POA: Diagnosis not present

## 2018-10-03 DIAGNOSIS — Z1389 Encounter for screening for other disorder: Secondary | ICD-10-CM | POA: Diagnosis not present

## 2018-10-03 DIAGNOSIS — Z0000 Encounter for general adult medical examination without abnormal findings: Secondary | ICD-10-CM | POA: Diagnosis not present

## 2018-10-03 DIAGNOSIS — F5104 Psychophysiologic insomnia: Secondary | ICD-10-CM | POA: Diagnosis not present

## 2018-10-03 DIAGNOSIS — R413 Other amnesia: Secondary | ICD-10-CM | POA: Diagnosis not present

## 2018-10-03 DIAGNOSIS — M542 Cervicalgia: Secondary | ICD-10-CM | POA: Diagnosis not present

## 2018-10-03 DIAGNOSIS — R32 Unspecified urinary incontinence: Secondary | ICD-10-CM | POA: Diagnosis not present

## 2018-10-03 DIAGNOSIS — I10 Essential (primary) hypertension: Secondary | ICD-10-CM | POA: Diagnosis not present

## 2018-10-08 DIAGNOSIS — Z1231 Encounter for screening mammogram for malignant neoplasm of breast: Secondary | ICD-10-CM | POA: Diagnosis not present

## 2018-10-09 DIAGNOSIS — Z1212 Encounter for screening for malignant neoplasm of rectum: Secondary | ICD-10-CM | POA: Diagnosis not present

## 2018-11-12 DIAGNOSIS — I10 Essential (primary) hypertension: Secondary | ICD-10-CM | POA: Diagnosis not present

## 2019-05-03 IMAGING — MR MR HEAD W/O CM
8 of 11 series · 35 of 48 positions shown · non-contrast
Comparison: CT 08/29/2017

CLINICAL DATA: Acute presentation with visual disturbance and left
arm numbness over the last 4 days.

EXAM:
MRI HEAD WITHOUT CONTRAST
MRA HEAD WITHOUT CONTRAST
TECHNIQUE: Multiplanar, multiecho pulse sequences of the brain and surrounding
structures were obtained without intravenous contrast. Angiographic
images of the head were obtained using MRA technique without
contrast.

[Series 3: DWI · axial · 3.0mm · 1.09mm/px · z∈[-92,+40]mm · 9 of 90 slices shown (1 of 4)]
[im 1/90]
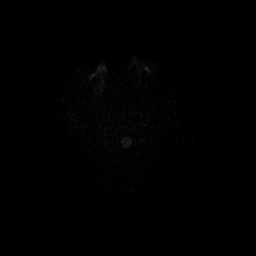
[im 12/90]
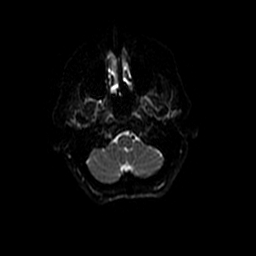
[im 23/90]
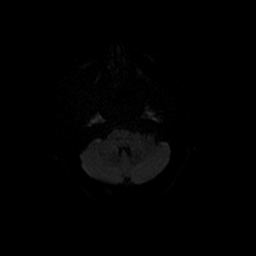
[im 34/90]
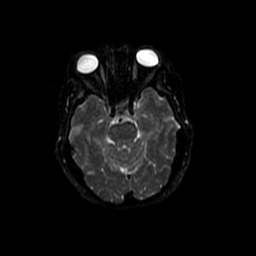
[im 45/90]
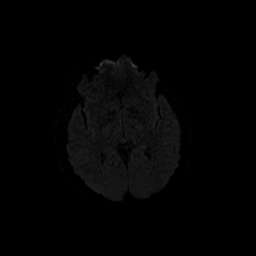
[im 56/90]
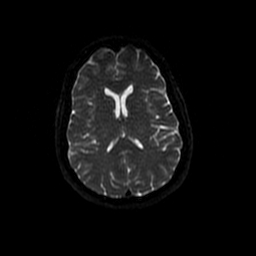
[im 67/90]
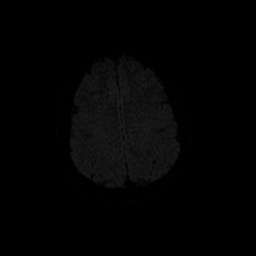
[im 78/90]
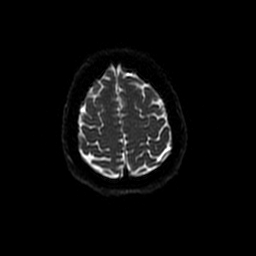
[im 90/90]
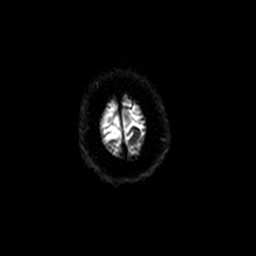

[Series 4: (id) mt fs · axial · 1.4mm · 0.43mm/px · z∈[-123,-48]mm · 6 of 152 slices shown]
[im 1/152]
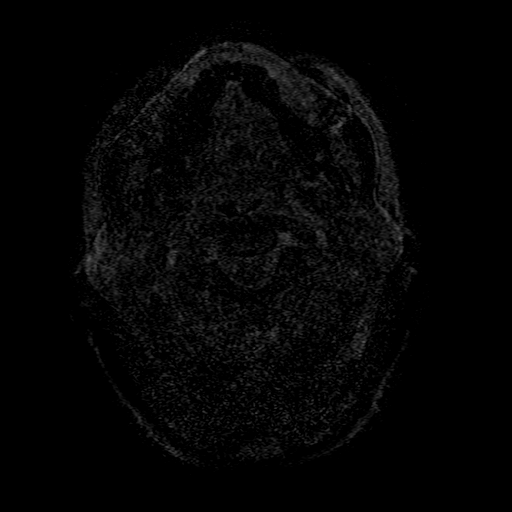
[im 22/152]
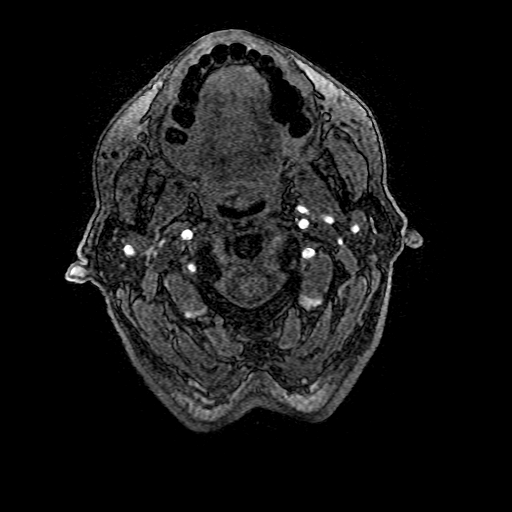
[im 44/152]
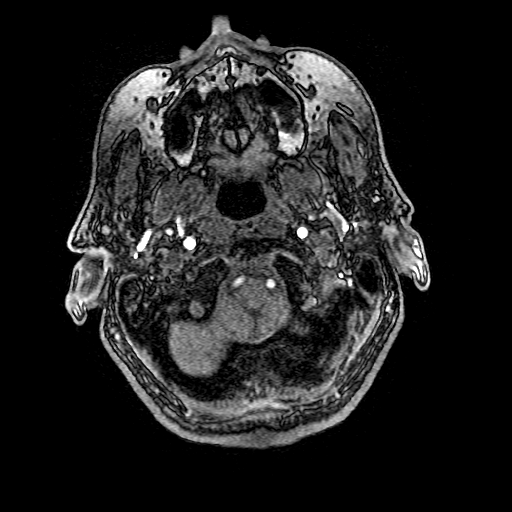
[im 65/152]
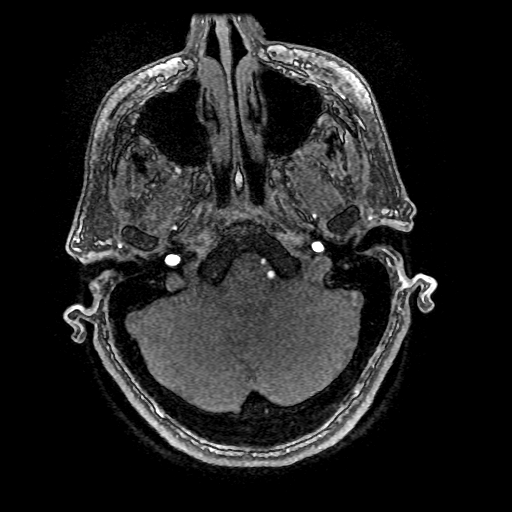
[im 87/152]
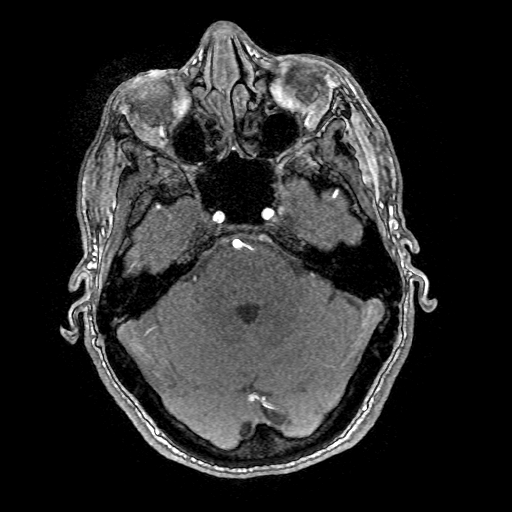
[im 108/152]
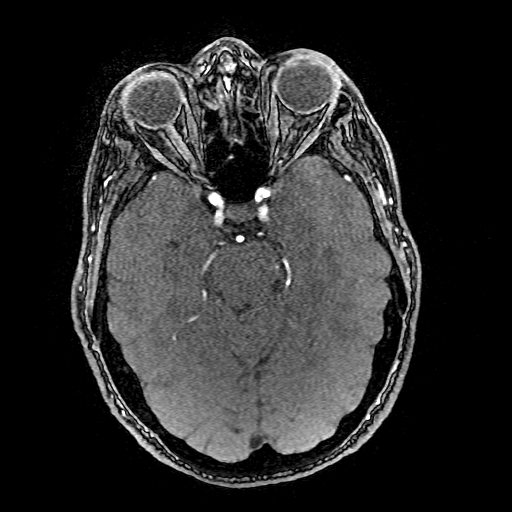

[Series 5: DWI · coronal · 5.0mm · 1.09mm/px · 6 of 64 slices shown (2 of 4)]
[im 1/64]
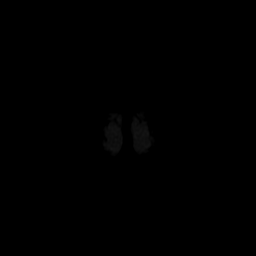
[im 13/64]
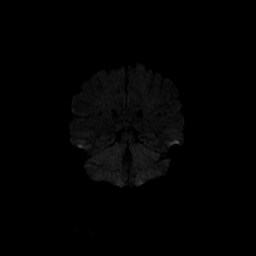
[im 26/64]
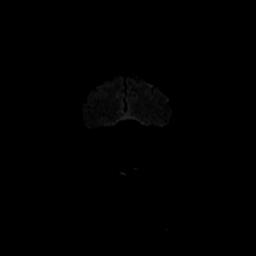
[im 38/64]
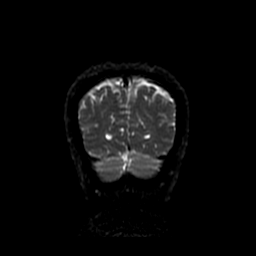
[im 51/64]
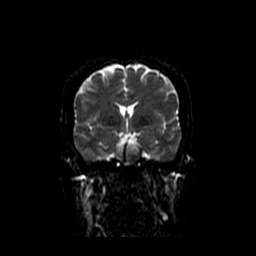
[im 64/64]
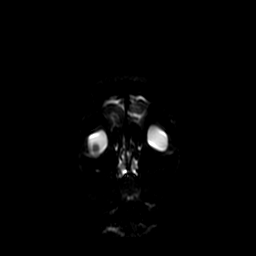

[Series 6: T1 · sagittal · 5.0mm · 0.47mm/px · 2 of 23 slices shown]
[im 1/23]
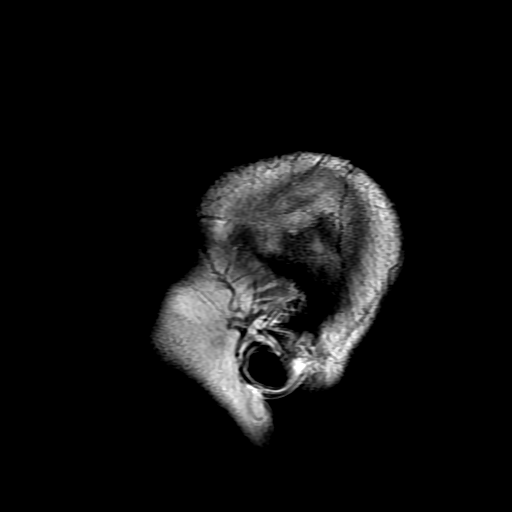
[im 23/23]
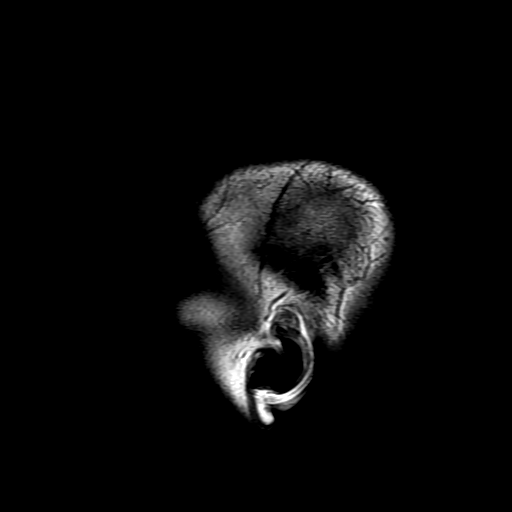

[Series 7: T2 · axial · 5.0mm · 0.43mm/px · z∈[-94,+38]mm · 2 of 23 slices shown]
[im 1/23]
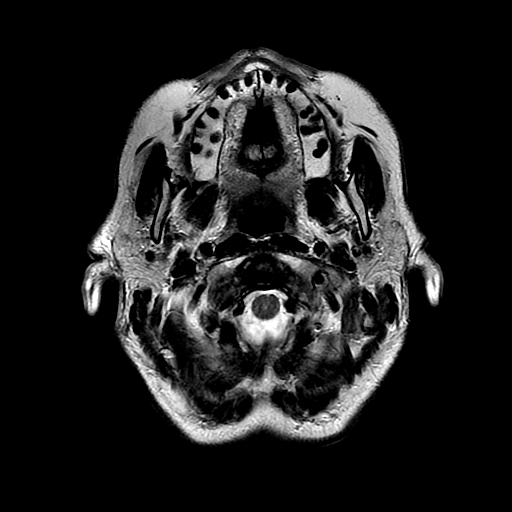
[im 23/23]
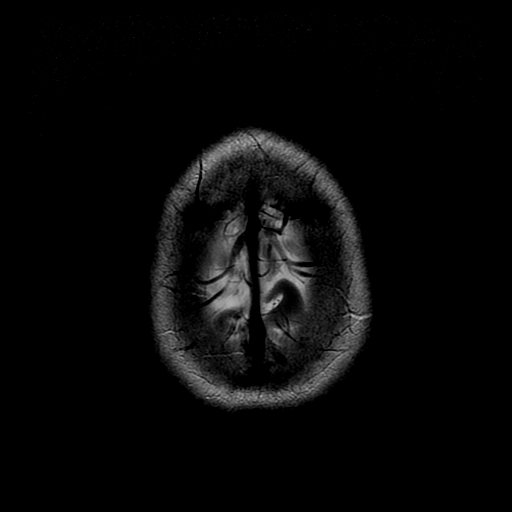

[Series 8: FLAIR · axial · 5.0mm · 0.43mm/px · z∈[-94,+38]mm · 2 of 23 slices shown]
[im 1/23]
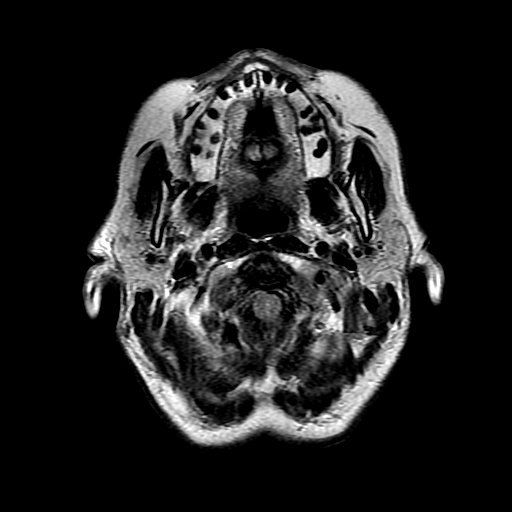
[im 23/23]
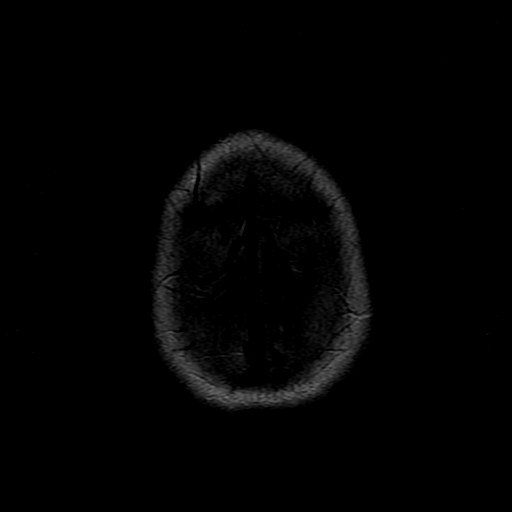

[Series 300: DWI · axial · 3.0mm · 1.09mm/px · z∈[-92,+40]mm · 5 of 45 slices shown (3 of 4)]
[im 1/45]
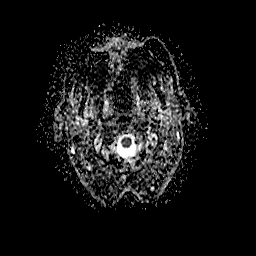
[im 12/45]
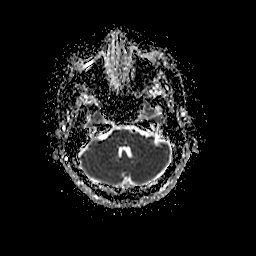
[im 23/45]
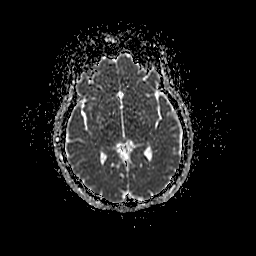
[im 34/45]
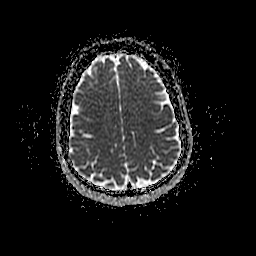
[im 45/45]
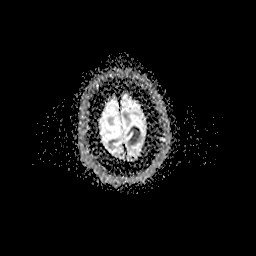

[Series 500: DWI · coronal · 5.0mm · 1.09mm/px · 3 of 32 slices shown (4 of 4)]
[im 1/32]
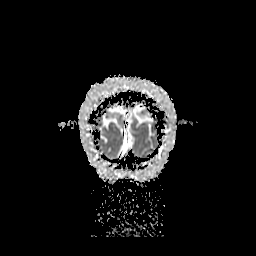
[im 16/32]
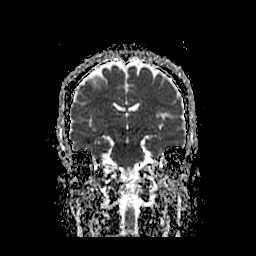
[im 32/32]
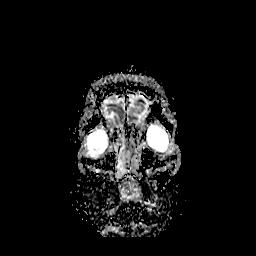

[35 of 48 positions shown; findings below may reference images not displayed]

FINDINGS: MRI HEAD FINDINGS

Brain: Diffusion imaging does not show any acute or subacute
infarction. The brainstem and cerebellum are normal. Cerebral
hemispheres are normal. No evidence of old infarction. No mass
lesion, hemorrhage, hydrocephalus or extra-axial collection.

Vascular: Major vessels at the base of the brain show flow.

Skull and upper cervical spine: Negative

Sinuses/Orbits: Clear/ normal.  Previous lens implant on the left.

Other: None

MRA HEAD FINDINGS

Both internal carotid arteries are widely patent into the brain. No
siphon stenosis. The anterior and middle cerebral vessels are patent
without proximal stenosis, aneurysm or vascular malformation.

Both vertebral arteries are widely patent to the basilar. No basilar
stenosis. Posterior circulation branch vessels appear normal.
IMPRESSION: Normal MRI of the brain, unusual at this age. No evidence of atrophy
or any previous large or small vessel insult.

Normal intracranial MR angiography.

## 2019-12-11 ENCOUNTER — Other Ambulatory Visit: Payer: Self-pay | Admitting: Internal Medicine

## 2019-12-11 DIAGNOSIS — R634 Abnormal weight loss: Secondary | ICD-10-CM

## 2019-12-11 DIAGNOSIS — K219 Gastro-esophageal reflux disease without esophagitis: Secondary | ICD-10-CM

## 2019-12-11 DIAGNOSIS — R0789 Other chest pain: Secondary | ICD-10-CM

## 2019-12-30 ENCOUNTER — Other Ambulatory Visit: Payer: Medicare HMO

## 2020-01-04 ENCOUNTER — Other Ambulatory Visit: Payer: Medicaid Other

## 2020-01-07 ENCOUNTER — Encounter: Payer: Self-pay | Admitting: Neurology

## 2020-01-07 ENCOUNTER — Other Ambulatory Visit: Payer: Self-pay | Admitting: Neurology

## 2020-01-11 ENCOUNTER — Other Ambulatory Visit: Payer: Self-pay

## 2020-01-11 ENCOUNTER — Ambulatory Visit
Admission: EM | Admit: 2020-01-11 | Discharge: 2020-01-11 | Disposition: A | Discharge status: Home / Self Care | Payer: Medicare Other | Attending: Physician Assistant | Admitting: Physician Assistant

## 2020-01-11 ENCOUNTER — Institutional Professional Consult (permissible substitution): Payer: Medicare HMO | Admitting: Neurology

## 2020-01-11 DIAGNOSIS — N3090 Cystitis, unspecified without hematuria: Secondary | ICD-10-CM | POA: Insufficient documentation

## 2020-01-11 DIAGNOSIS — M545 Low back pain, unspecified: Secondary | ICD-10-CM

## 2020-01-11 DIAGNOSIS — R509 Fever, unspecified: Secondary | ICD-10-CM | POA: Insufficient documentation

## 2020-01-11 DIAGNOSIS — Z20822 Contact with and (suspected) exposure to covid-19: Secondary | ICD-10-CM

## 2020-01-11 DIAGNOSIS — B9689 Other specified bacterial agents as the cause of diseases classified elsewhere: Secondary | ICD-10-CM

## 2020-01-11 LAB — POC SARS CORONAVIRUS 2 AG -  ED: SARS Coronavirus 2 Ag: NEGATIVE

## 2020-01-11 LAB — POCT URINALYSIS DIP (MANUAL ENTRY)
Bilirubin, UA: NEGATIVE
Glucose, UA: NEGATIVE mg/dL
Ketones, POC UA: NEGATIVE mg/dL
Nitrite, UA: POSITIVE — AB
Protein Ur, POC: 100 mg/dL — AB
Spec Grav, UA: 1.025 (ref 1.010–1.025)
Urobilinogen, UA: 0.2 E.U./dL
pH, UA: 5.5 (ref 5.0–8.0)

## 2020-01-11 MED ORDER — ONDANSETRON 4 MG PO TBDP: 4.0000 mg | ORAL_TABLET | Freq: Three times a day (TID) | ORAL | 0 refills | Status: DC | PRN

## 2020-01-11 MED ORDER — CEPHALEXIN 500 MG PO CAPS: 500.0000 mg | ORAL_CAPSULE | Freq: Three times a day (TID) | ORAL | 0 refills | Status: AC

## 2020-01-11 NOTE — ED Provider Notes (Signed)
[facility]    CSN: [id number] Arrival date & time: 01/11/20  1353      History   Chief Complaint Chief Complaint  Patient presents with  . Headache  . Fever    102.6 on Friday  . Nausea    HPI Alison Mcguire is a 79 y.o. female.   79 year old female with history of HTN, HLD, comes in for 1 week history of cloudy, foul smelling urine. 3 days ago, started having fevers with Tmax 102 and therefore came in for evaluation. Patient's fever responsive to antipyretic, last fever 2 days ago. Also complains of headache, low back pain. She was given macrobid BID x 7 days due to UTI symptoms, which she finished with good relief of symptoms before current symptom onset. States was symptom free for 2-3 days prior to current symptom onset. She had some rhinorrhea, sore throat, mild cough.  Denies abdominal pain. Nausea without vomiting. Though has avoided solid foods for about 3-4 days to avoid vomiting. Has been doing liquid diet.      Past Medical History:  Diagnosis Date  . Acid reflux   . Anxiety   . Depression   . GI bleed   . Headache   . Hyperlipidemia   . Hypertension   . Memory loss    mild    Patient Active Problem List   Diagnosis Date Noted  . 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 Overactive bladder 08/31/2017  . Hair loss 08/31/2017  . Transient vision disturbance   . Colitis 07/28/2012  . Bloody diarrhea 07/28/2012  . Abdominal pain 07/28/2012  . GERD (gastroesophageal reflux disease) 07/28/2012    Past Surgical History:  Procedure Laterality Date  . ABDOMINAL HYSTERECTOMY    . BREAST ENHANCEMENT SURGERY     post masectomy  . BREAST SURGERY    . COLONOSCOPY      OB History   No obstetric history on file.      Home Medications    Prior to Admission medications   Medication Sig Start Date End Date Taking? Authorizing Provider  acetaminophen (TYLENOL) 500 MG tablet Take 500 mg by mouth every 8 (eight) hours as needed.    [provider]  ALPRAZolam Duanne Moron) 0.5 MG tablet Take 1 mg by mouth at bedtime.     [provider]  Biotin 5000 MCG CAPS Take 1 capsule by mouth at bedtime.    [provider]  calcium carbonate (OSCAL) 1500 (600 Ca) MG TABS tablet Take by mouth 2 (two) times daily with a meal.    [provider]  cephALEXin (KEFLEX) 500 MG capsule Take 1 capsule (500 mg total) by mouth 3 (three) times daily for 7 days. 01/11/20 01/18/20  Ok Edwards, PA-C  cetirizine (ZYRTEC) 10 MG tablet Take 10 mg by mouth daily.    [provider]  GLUCOS-CHONDROIT-HYALURON-MSM PO Take 1 tablet by mouth 3 (three) times daily.     [provider]  meloxicam (MOBIC) 15 MG tablet Take 15 mg by mouth daily.    [provider]  metoprolol succinate (TOPROL-XL) 50 MG 24 hr tablet Take 50 mg by mouth daily. 12/08/19   [provider]  Multiple Vitamins-Minerals (MULTIVITAMIN WITH MINERALS) tablet Take 1 tablet by mouth daily.    [provider]  multivitamin-lutein (OCUVITE-LUTEIN) CAPS capsule Take 1 capsule by mouth daily.    [provider]  omeprazole (PRILOSEC) 20 MG capsule Take 20 mg by mouth daily.  [provider]  ondansetron (ZOFRAN ODT) 4 MG disintegrating tablet Take 1 tablet (4 mg total) by mouth every 8 (eight) hours as needed for nausea or vomiting. 01/11/20   Tasia Catchings, Devante Capano V, PA-C  pravastatin (PRAVACHOL) 20 MG tablet Add'l Sig Add'l Sig oral Add'l Sig 12/22/19   [provider]  tolterodine (DETROL LA) 4 MG 24 hr capsule Take 4 mg by mouth daily. 11/29/19   [provider]  Vitamin D, Ergocalciferol, (DRISDOL) 50000 UNITS CAPS Take 50,000 Units by mouth every 7 (seven) days. Every Wednesday    [provider]  Zinc 50 MG CAPS Take 1 capsule by mouth daily.    [provider]    Family History Family History  Problem Relation Age of Onset  . Hypertension Sister   . Diabetes Son   . Stroke Sister     . Diabetes Sister     Social History Social History   Tobacco Use  . Smoking status: Never Smoker  . Smokeless tobacco: Never Used  Substance Use Topics  . Alcohol use: No  . Drug use: No     Allergies   Codeine, Aspirin, Azithromycin, Clinoril [sulindac], Demerol [meperidine], Dilaudid [hydromorphone hcl], Ether, Soma [carisoprodol], Topamax [topiramate], Betadine [povidone iodine], and Nickel   Review of Systems Review of Systems  Reason unable to perform ROS: See HPI as above.     Physical Exam Triage Vital Signs ED Triage Vitals  Enc Vitals Group     BP 01/11/20 1427 132/80     Pulse Rate 01/11/20 1427 82     Resp 01/11/20 1427 16     Temp 01/11/20 1427 98.5 F (36.9 C)     Temp Source 01/11/20 1427 Oral     SpO2 01/11/20 1427 96 %     Weight --      Height --      Head Circumference --      Peak Flow --      Pain Score 01/11/20 1431 3     Pain Loc --      Pain Edu? --      Excl. in [place]? --    No data found.  Updated Vital Signs BP 132/80 (BP Location: Left Arm)   Pulse 82   Temp 98.5 F (36.9 C) (Oral)   Resp 16   LMP 07/28/2012   SpO2 96%   Physical Exam Constitutional:      General: She is not in acute distress.    Appearance: She is well-developed. She is not ill-appearing, toxic-appearing or diaphoretic.  HENT:     Head: Normocephalic and atraumatic.  Eyes:     Conjunctiva/sclera: Conjunctivae normal.     Pupils: Pupils are equal, round, and reactive to light.  Cardiovascular:     Rate and Rhythm: Normal rate and regular rhythm.  Pulmonary:     Effort: Pulmonary effort is normal. No respiratory distress.     Comments: LCTAB Abdominal:     General: Bowel sounds are normal.     Palpations: Abdomen is soft.     Tenderness: There is no abdominal tenderness. There is no right CVA tenderness, left CVA tenderness, guarding or rebound.  Musculoskeletal:     Cervical back: Normal range of motion and neck supple.  Skin:    General: Skin is  warm and dry.  Neurological:     Mental Status: She is alert and oriented to person, place, and time.  Psychiatric:        Behavior:  Behavior normal.        Judgment: Judgment normal.      UC Treatments / Results  Labs (all labs ordered are listed, but only abnormal results are displayed) Labs Reviewed  POCT URINALYSIS DIP (MANUAL ENTRY) - Abnormal; Notable for the following components:      Result Value   Clarity, UA cloudy (*)    Blood, UA moderate (*)    Protein Ur, POC =100 (*)    Nitrite, UA Positive (*)    Leukocytes, UA Large (3+) (*)    All other components within normal limits  NOVEL CORONAVIRUS, NAA  URINE CULTURE  POC SARS CORONAVIRUS 2 AG -  ED    EKG   Radiology No results found.  Procedures Procedures (including critical care time)  Medications Ordered in UC Medications - No data to display  Initial Impression / Assessment and Plan / UC Course  I have reviewed the triage vital signs and the nursing notes.  Pertinent labs & imaging results that were available during my care of the patient were reviewed by me and considered in my medical decision making (see chart for details).    Rapid COVID negative. COVID PCR sent, patient to quarantine until testing results return.  Urine dipstick positive for infection.  Given fever, will start Keflex 3 times daily for 7 days.  Discussed may extend the course if symptoms not completely resolved at the end of 7 days, to return call if this is the case.  Urine culture sent.  Push fluids.  Return precautions given.  Patient expresses understanding and agrees to plan.  Final Clinical Impressions(s) / UC Diagnoses   Final diagnoses:  Bilateral low back pain without sciatica, unspecified chronicity  Fever and chills  Cystitis   ED Prescriptions    Medication Sig Dispense Auth. Provider   cephALEXin (KEFLEX) 500 MG capsule Take 1 capsule (500 mg total) by mouth 3 (three) times daily for 7 days. 21 capsule Jaivon Vanbeek V,  PA-C   ondansetron (ZOFRAN ODT) 4 MG disintegrating tablet Take 1 tablet (4 mg total) by mouth every 8 (eight) hours as needed for nausea or vomiting. 20 tablet Ok Edwards, PA-C     PDMP not reviewed this encounter.   Ok Edwards, PA-C 01/11/20 1609

## 2020-01-11 NOTE — ED Triage Notes (Addendum)
Patient presents with complaints of cloudy, foul smelling urine, fever (102 on Friday), low back pain and headache that started x one week ago. She notes completing a 7 day course of Macrobid prior to symptom recurrence.  She reports having nausea on Saturday evening and continued fever. She reports runny nose, slight sore throat and a mild cough started along with other symptoms.

## 2020-01-11 NOTE — Discharge Instructions (Signed)
Rapid Covid negative.  Covid PCR sent, please quarantine until testing results return.  Your urine showed that you have a urinary tract infection, I am going to cover for a kidney infection due to your fever.  Start Keflex as directed.  Zofran as needed for nausea/vomiting. Keep hydrated, urine should be clear to pale yellow in color.  If you are finishing Keflex, but continued symptoms, please give [place] a call, and may extend the course.  If urine culture shows that we need to switch antibiotics, we will give you a call.  If any worsening symptoms, fever >104 despite ibuprofen/Tylenol, severe abdominal pain, nausea/vomiting not controlled by medicines, weakness, dizziness, go to the emergency department for further evaluation.

## 2020-01-12 LAB — NOVEL CORONAVIRUS, NAA: SARS-CoV-2, NAA: NOT DETECTED

## 2020-01-14 LAB — URINE CULTURE: Culture: >=100000 — AB

## 2020-01-15 ENCOUNTER — Telehealth: Payer: Self-pay

## 2020-08-26 ENCOUNTER — Other Ambulatory Visit: Payer: Self-pay | Admitting: Urology

## 2020-09-05 ENCOUNTER — Other Ambulatory Visit (HOSPITAL_COMMUNITY)
Admission: RE | Admit: 2020-09-05 | Discharge: 2020-09-05 | Disposition: A | Discharge status: Home / Self Care | Payer: Medicare Other | Source: Ambulatory Visit | Attending: Urology | Admitting: Urology

## 2020-09-05 ENCOUNTER — Encounter (HOSPITAL_BASED_OUTPATIENT_CLINIC_OR_DEPARTMENT_OTHER): Payer: Self-pay | Admitting: Urology

## 2020-09-05 ENCOUNTER — Other Ambulatory Visit: Payer: Self-pay

## 2020-09-05 DIAGNOSIS — Z01812 Encounter for preprocedural laboratory examination: Secondary | ICD-10-CM | POA: Insufficient documentation

## 2020-09-05 DIAGNOSIS — Z20822 Contact with and (suspected) exposure to covid-19: Secondary | ICD-10-CM | POA: Diagnosis not present

## 2020-09-05 LAB — SARS CORONAVIRUS 2 (TAT 6-24 HRS): SARS Coronavirus 2: NEGATIVE

## 2020-09-05 NOTE — Progress Notes (Addendum)
ADDENDUM:  Pt called via phone and stated she is taking an antibiotic now daily in am.  Updated epic med list.  Pt verbalized understanding that if she can take without food and can take the antibiotic otherwise do not take morning of surgery.  Spoke w/ via phone for pre-op interview--- PT Lab needs dos----   Istat and EKG            Lab results------ no COVID test ------ 09-05-2020 @ 1400 Arrive at ------- 1145 NPO after MN NO Solid Food.  Clear liquids from MN until--- 1045 Medications to take morning of surgery ----- Prilosec, Claritin Diabetic medication ----- n/a Patient Special Instructions ----- n/a Pre-Op special Istructions ----- n/a Patient verbalized understanding of instructions that were given at this phone interview. Patient denies shortness of breath, chest pain, fever, cough at this phone interview.

## 2020-09-08 ENCOUNTER — Encounter (HOSPITAL_BASED_OUTPATIENT_CLINIC_OR_DEPARTMENT_OTHER): Payer: Self-pay | Admitting: Urology

## 2020-09-08 ENCOUNTER — Ambulatory Visit (HOSPITAL_BASED_OUTPATIENT_CLINIC_OR_DEPARTMENT_OTHER)
Admission: RE | Admit: 2020-09-08 | Discharge: 2020-09-08 | Disposition: A | Discharge status: Home / Self Care | Payer: Medicare Other | Attending: Urology | Admitting: Urology

## 2020-09-08 ENCOUNTER — Ambulatory Visit (HOSPITAL_BASED_OUTPATIENT_CLINIC_OR_DEPARTMENT_OTHER): Payer: Medicare Other | Admitting: Anesthesiology

## 2020-09-08 ENCOUNTER — Encounter (HOSPITAL_BASED_OUTPATIENT_CLINIC_OR_DEPARTMENT_OTHER)
Admission: RE | Disposition: A | Discharge status: Home / Self Care | Payer: Self-pay | Source: Home / Self Care | Attending: Urology

## 2020-09-08 ENCOUNTER — Other Ambulatory Visit: Payer: Self-pay

## 2020-09-08 DIAGNOSIS — F419 Anxiety disorder, unspecified: Secondary | ICD-10-CM | POA: Diagnosis not present

## 2020-09-08 DIAGNOSIS — Z791 Long term (current) use of non-steroidal anti-inflammatories (NSAID): Secondary | ICD-10-CM | POA: Diagnosis not present

## 2020-09-08 DIAGNOSIS — N131 Hydronephrosis with ureteral stricture, not elsewhere classified: Secondary | ICD-10-CM | POA: Insufficient documentation

## 2020-09-08 DIAGNOSIS — E785 Hyperlipidemia, unspecified: Secondary | ICD-10-CM | POA: Diagnosis not present

## 2020-09-08 DIAGNOSIS — I10 Essential (primary) hypertension: Secondary | ICD-10-CM | POA: Insufficient documentation

## 2020-09-08 DIAGNOSIS — K219 Gastro-esophageal reflux disease without esophagitis: Secondary | ICD-10-CM | POA: Insufficient documentation

## 2020-09-08 DIAGNOSIS — F329 Major depressive disorder, single episode, unspecified: Secondary | ICD-10-CM | POA: Diagnosis not present

## 2020-09-08 DIAGNOSIS — Z79899 Other long term (current) drug therapy: Secondary | ICD-10-CM | POA: Insufficient documentation

## 2020-09-08 DIAGNOSIS — J302 Other seasonal allergic rhinitis: Secondary | ICD-10-CM | POA: Insufficient documentation

## 2020-09-08 DIAGNOSIS — Z87440 Personal history of urinary (tract) infections: Secondary | ICD-10-CM | POA: Insufficient documentation

## 2020-09-08 HISTORY — DX: Unspecified osteoarthritis, unspecified site: M19.90

## 2020-09-08 HISTORY — DX: Personal history of diseases of the blood and blood-forming organs and certain disorders involving the immune mechanism: Z86.2

## 2020-09-08 HISTORY — DX: Gastro-esophageal reflux disease without esophagitis: K21.9

## 2020-09-08 HISTORY — DX: Personal history of other (healed) physical injury and trauma: Z87.828

## 2020-09-08 HISTORY — DX: Stress incontinence (female) (male): N39.3

## 2020-09-08 HISTORY — PX: CYSTOSCOPY WITH RETROGRADE PYELOGRAM, URETEROSCOPY AND STENT PLACEMENT: SHX5789

## 2020-09-08 HISTORY — DX: Other migraine, not intractable, without status migrainosus: G43.809

## 2020-09-08 HISTORY — DX: Unspecified hydronephrosis: N13.30

## 2020-09-08 HISTORY — DX: Diffuse cystic mastopathy of unspecified breast: N60.19

## 2020-09-08 HISTORY — DX: Other seasonal allergic rhinitis: J30.2

## 2020-09-08 LAB — POCT I-STAT, CHEM 8
BUN: 29 mg/dL — ABNORMAL HIGH (ref 8–23)
Calcium, Ion: 1.3 mmol/L (ref 1.15–1.40)
Chloride: 105 mmol/L (ref 98–111)
Creatinine, Ser: 1 mg/dL (ref 0.44–1.00)
Glucose, Bld: 97 mg/dL (ref 70–99)
HCT: 41 % (ref 36.0–46.0)
Hemoglobin: 13.9 g/dL (ref 12.0–15.0)
Potassium: 4.5 mmol/L (ref 3.5–5.1)
Sodium: 145 mmol/L (ref 135–145)
TCO2: 29 mmol/L (ref 22–32)

## 2020-09-08 SURGERY — CYSTOURETEROSCOPY, WITH RETROGRADE PYELOGRAM AND STENT INSERTION
Anesthesia: General | Site: Urethra | Laterality: Right

## 2020-09-08 MED ORDER — FENTANYL CITRATE (PF) 100 MCG/2ML IJ SOLN
INTRAMUSCULAR | Status: DC | PRN
Start: 2020-09-08 — End: 2020-09-08
  Administered 2020-09-08: 25 ug via INTRAVENOUS
  Administered 2020-09-08: 50 ug via INTRAVENOUS
  Administered 2020-09-08: 25 ug via INTRAVENOUS

## 2020-09-08 MED ORDER — ACETAMINOPHEN 160 MG/5ML PO SOLN: 325.0000 mg | ORAL | Status: DC | PRN

## 2020-09-08 MED ORDER — FENTANYL CITRATE (PF) 100 MCG/2ML IJ SOLN: 25.0000 ug | INTRAMUSCULAR | Status: DC | PRN

## 2020-09-08 MED ORDER — ONDANSETRON HCL 4 MG/2ML IJ SOLN
INTRAMUSCULAR | Status: AC
  Filled 2020-09-08: qty 2

## 2020-09-08 MED ORDER — ACETAMINOPHEN 325 MG PO TABS: 325.0000 mg | ORAL_TABLET | ORAL | Status: DC | PRN

## 2020-09-08 MED ORDER — SODIUM CHLORIDE 0.9 % IR SOLN
Status: DC | PRN
  Administered 2020-09-08: 3000 mL

## 2020-09-08 MED ORDER — IOHEXOL 300 MG/ML  SOLN
INTRAMUSCULAR | Status: DC | PRN
  Administered 2020-09-08: 20 mL via URETHRAL

## 2020-09-08 MED ORDER — GENTAMICIN SULFATE 40 MG/ML IJ SOLN
5.0000 mg/kg | INTRAVENOUS | Status: AC
  Administered 2020-09-08: 310 mg via INTRAVENOUS
  Filled 2020-09-08: qty 7.75

## 2020-09-08 MED ORDER — FENTANYL CITRATE (PF) 100 MCG/2ML IJ SOLN
INTRAMUSCULAR | Status: AC
  Filled 2020-09-08: qty 2

## 2020-09-08 MED ORDER — EPHEDRINE 5 MG/ML INJ
INTRAVENOUS | Status: AC
  Filled 2020-09-08: qty 10

## 2020-09-08 MED ORDER — LIDOCAINE 2% (20 MG/ML) 5 ML SYRINGE
INTRAMUSCULAR | Status: AC
  Filled 2020-09-08: qty 5

## 2020-09-08 MED ORDER — PROPOFOL 10 MG/ML IV BOLUS
INTRAVENOUS | Status: DC | PRN
  Administered 2020-09-08: 20 mg via INTRAVENOUS
  Administered 2020-09-08: 120 mg via INTRAVENOUS

## 2020-09-08 MED ORDER — DEXAMETHASONE SODIUM PHOSPHATE 4 MG/ML IJ SOLN
INTRAMUSCULAR | Status: DC | PRN
  Administered 2020-09-08: 4 mg via INTRAVENOUS

## 2020-09-08 MED ORDER — LACTATED RINGERS IV SOLN: INTRAVENOUS | Status: DC

## 2020-09-08 MED ORDER — ONDANSETRON HCL 4 MG/2ML IJ SOLN: 4.0000 mg | Freq: Once | INTRAMUSCULAR | Status: DC | PRN

## 2020-09-08 MED ORDER — EPHEDRINE SULFATE-NACL 50-0.9 MG/10ML-% IV SOSY
PREFILLED_SYRINGE | INTRAVENOUS | Status: DC | PRN
  Administered 2020-09-08: 10 mg via INTRAVENOUS

## 2020-09-08 MED ORDER — LIDOCAINE 2% (20 MG/ML) 5 ML SYRINGE
INTRAMUSCULAR | Status: DC | PRN
  Administered 2020-09-08: 60 mg via INTRAVENOUS

## 2020-09-08 MED ORDER — ONDANSETRON HCL 4 MG/2ML IJ SOLN
INTRAMUSCULAR | Status: DC | PRN
  Administered 2020-09-08: 4 mg via INTRAVENOUS

## 2020-09-08 SURGICAL SUPPLY — 24 items
BAG DRAIN URO-CYSTO SKYTR STRL (DRAIN) ×3 IMPLANT
BAG DRN UROCATH (DRAIN) ×1
CATH INTERMIT  6FR 70CM (CATHETERS) ×3 IMPLANT
CLOTH BEACON ORANGE TIMEOUT ST (SAFETY) ×3 IMPLANT
FIBER LASER TRAC TIP (UROLOGICAL SUPPLIES) IMPLANT
GLOVE BIO SURGEON STRL SZ 6.5 (GLOVE) ×2 IMPLANT
GLOVE BIO SURGEON STRL SZ8 (GLOVE) ×3 IMPLANT
GLOVE BIO SURGEONS STRL SZ 6.5 (GLOVE) ×2
GOWN STRL REUS W/ TWL LRG LVL3 (GOWN DISPOSABLE) IMPLANT
GOWN STRL REUS W/ TWL XL LVL3 (GOWN DISPOSABLE) ×1 IMPLANT
GOWN STRL REUS W/TWL LRG LVL3 (GOWN DISPOSABLE) ×3
GOWN STRL REUS W/TWL XL LVL3 (GOWN DISPOSABLE) ×3
GUIDEWIRE STR DUAL SENSOR (WIRE) ×4 IMPLANT
IV NS IRRIG 3000ML ARTHROMATIC (IV SOLUTION) ×3 IMPLANT
KIT BALLIN UROMAX 15FX10 (LABEL) IMPLANT
KIT TURNOVER CYSTO (KITS) ×3 IMPLANT
MANIFOLD NEPTUNE II (INSTRUMENTS) ×3 IMPLANT
NS IRRIG 500ML POUR BTL (IV SOLUTION) ×3 IMPLANT
PACK CYSTO (CUSTOM PROCEDURE TRAY) ×3 IMPLANT
SET HIGH PRES BAL DIL (LABEL) ×3
STENT URET 6FRX24 CONTOUR (STENTS) ×3 IMPLANT
TUBE CONNECTING 12'X1/4 (SUCTIONS) ×1
TUBE CONNECTING 12X1/4 (SUCTIONS) ×1 IMPLANT
TUBING UROLOGY SET (TUBING) ×3 IMPLANT

## 2020-09-08 NOTE — Anesthesia Preprocedure Evaluation (Addendum)
Anesthesia Evaluation  Patient identified by MRN, date of birth, ID band Patient awake    Reviewed: Patient's Chart, lab work & pertinent test results  Airway Mallampati: I       Dental  (+) Teeth Intact   Pulmonary neg pulmonary ROS,    Pulmonary exam normal        Cardiovascular hypertension, Pt. on medications  Rhythm:Regular Rate:Normal     Neuro/Psych  Headaches, Anxiety Depression    GI/Hepatic GERD  Medicated,  Endo/Other    Renal/GU Renal diseaseRight hydronephrosis Bladder dysfunction  Stress incontinence    Musculoskeletal  (+) Arthritis , Osteoarthritis,    Abdominal Normal abdominal exam  (+)  Abdomen: soft. Bowel sounds: normal.  Peds negative pediatric ROS (+)  Hematology negative hematology ROS (+)   Anesthesia Other Findings   Reproductive/Obstetrics                            Anesthesia Physical Anesthesia Plan  ASA: II  Anesthesia Plan: General   Post-op Pain Management:    Induction:   PONV Risk Score and Plan: 3 and Ondansetron and Dexamethasone  Airway Management Planned: Mask and LMA  Additional Equipment: None  Intra-op Plan:   Post-operative Plan: Extubation in OR  Informed Consent: I have reviewed the patients History and Physical, chart, labs and discussed the procedure including the risks, benefits and alternatives for the proposed anesthesia with the patient or authorized representative who has indicated his/her understanding and acceptance.     Dental advisory given  Plan Discussed with: CRNA and Surgeon  Anesthesia Plan Comments: (Lab Results      Component                Value               Date                      WBC                      5.4                 08/29/2017                HGB                      13.9                09/08/2020                HCT                      41.0                09/08/2020                MCV                       89.6                08/29/2017                PLT                      254                 08/29/2017  Covid-19 Nucleic Acid Test Results Lab Results      Component                Value               Date                      SARSCOV2NAA              NEGATIVE            09/05/2020                [place]              Not Detected        01/11/2020                SARSCOV2                 Negative            01/11/2020          )        Anesthesia Quick Evaluation

## 2020-09-08 NOTE — H&P (Signed)
H&P  Chief Complaint: Right hydronephrosis  History of Present Illness: Alison Mcguire is a 79 y.o. year old female with h/o recurrent UTI's. Recent renal U/S performed for evaluation showed severe Rt hydro with CT A/P verifying this. No evidence of stone seen. She presents for cysto, Rt RGP and possible URS/Stent.  Past Medical History:  Diagnosis Date  . Anxiety   . Depression   . Fibrocystic breast   . GERD (gastroesophageal reflux disease)   . History of anemia   . History of lower GI bleeding 05/2009   s/p colonoscopy w/ endoclip of bleed  . History of traumatic injury of head    per pt at age 8 or 43 fell from tree , skull fracture with loc, no surgery, not in coma,  recovered with residual migraines  . Hydronephrosis, right   . Hyperlipidemia   . Hypertension   . OA (osteoarthritis)   . Seasonal allergies   . SUI (stress urinary incontinence, female)   . Vestibular migraine    hx complicated migraine    Past Surgical History:  Procedure Laterality Date  . BREAST IMPLANT EXCHANGE Bilateral x3 one side and x1 on the other side ,  last surgery approx. 1990s  . COLONOSCOPY    . EXPLORATORY THORACOTOMY/ RESECTION ESOPHAGEAL DIVERTICULUM/ ESOPHAGEAL MYOTOMY/  BELSEY MARK IV FUNDOPLICATION Left [id number]   DR BURNEY @[facility]   . SIMPLE MASTECTOMY Bilateral 1980s   per pt done for atypical cells, had fibrocystic breast  . TONSILLECTOMY AND ADENOIDECTOMY  1970s  . VAGINAL HYSTERECTOMY  1970s    Home Medications:  Medications Prior to Admission  Medication Sig Dispense Refill  . acetaminophen (TYLENOL) 500 MG tablet Take 500 mg by mouth every 8 (eight) hours as needed.    . ALPRAZolam (XANAX) 0.5 MG tablet Take 1 mg by mouth at bedtime.     . Biotin 5000 MCG CAPS Take 1 capsule by mouth at bedtime.    . Calcium Carb-Cholecalciferol (CALCIUM 600+D3 PO) Take 1 tablet by mouth in the morning and at bedtime.    . calcium carbonate (OSCAL) 1500 (600 Ca) MG TABS tablet Take by  mouth 2 (two) times daily with a meal.    . cephALEXin (KEFLEX) 500 MG capsule Take 500 mg by mouth at bedtime.    . Cranberry-Vitamin C (AZO CRANBERRY URINARY TRACT) 250-60 MG CAPS Take by mouth.    . Ginkgo Biloba 40 MG TABS Take by mouth daily.    Marland Kitchen GLUCOS-CHONDROIT-HYALURON-MSM PO Take 1-2 tablets by mouth in the morning and at bedtime.     . Glycerin-Hypromellose-PEG 400 (DRY EYE RELIEF DROPS OP) Apply to eye as needed.    . loratadine (CLARITIN) 10 MG tablet Take 10 mg by mouth daily.    . meloxicam (MOBIC) 15 MG tablet Take 15 mg by mouth daily.    . metoprolol succinate (TOPROL-XL) 50 MG 24 hr tablet Take 50 mg by mouth at bedtime.     . Multiple Vitamins-Minerals (HAIR SKIN NAILS PO) Take by mouth daily.    . Multiple Vitamins-Minerals (MULTIVITAMIN WITH MINERALS) tablet Take 1 tablet by mouth daily.    . multivitamin-lutein (OCUVITE-LUTEIN) CAPS capsule Take 1 capsule by mouth daily.    Marland Kitchen olopatadine (PATADAY) 0.1 % ophthalmic solution 1 drop 2 (two) times daily as needed for allergies.    . Omega-3 Fatty Acids (FISH OIL PO) Take by mouth daily.    Marland Kitchen omeprazole (PRILOSEC) 20 MG capsule Take 20 mg by mouth daily.    Marland Kitchen  pravastatin (PRAVACHOL) 20 MG tablet Take 20 mg by mouth at bedtime.     . tolterodine (DETROL LA) 4 MG 24 hr capsule Take 4 mg by mouth daily.     . Vitamin D, Ergocalciferol, (DRISDOL) 50000 UNITS CAPS Take 50,000 Units by mouth every 7 (seven) days. Every Wednesday    . Zinc 50 MG CAPS Take 1 capsule by mouth at bedtime.     . cetirizine (ZYRTEC) 10 MG tablet Take 10 mg by mouth daily. (Patient not taking: Reported on 09/05/2020)      Allergies:  Allergies  Allergen Reactions  . Codeine Shortness Of Breath and Rash  . Ambien [Zolpidem]     Unknown reaction  . Aspirin Other (See Comments)    History upper GI bleed;  Avoids all nsaids  . Botox [Botulinum Toxin Type A]     Per pt had reaction as child at injection site, was never to have botox   . Clinoril  [Sulindac] Other (See Comments)    Avoids due to hx gi bleed  . Demerol [Meperidine] Other (See Comments)    Unknown reaction  . Dilaudid [Hydromorphone Hcl] Nausea And Vomiting  . Ether Other (See Comments)    Passed out   . Flagyl [Metronidazole] Diarrhea  . Penicillins   . Soma [Carisoprodol] Other (See Comments)    Migraines   . Topamax [Topiramate] Other (See Comments)    Gait disturbance, blurred vision  . Adhesive [Tape] Rash    Redness and peeling of skin  . Azithromycin Rash and Other (See Comments)    And tightness in throat  . Betadine [Povidone Iodine] Rash    And skin blisters  . Nickel Rash and Other (See Comments)    Family History  Problem Relation Age of Onset  . Hypertension Sister   . Diabetes Son   . Stroke Sister   . Diabetes Sister     Social History:  reports that she has never smoked. She has never used smokeless tobacco. She reports that she does not drink alcohol and does not use drugs.  ROS: A complete review of systems was performed.  All systems are negative except for pertinent findings as noted.  Physical Exam:  Vital signs in last 24 hours: Temp:  [98.4 F (36.9 C)] 98.4 F (36.9 C) (09/16 1238) Pulse Rate:  [72] 72 (09/16 1238) Resp:  [14] 14 (09/16 1238) BP: (150)/(79) 150/79 (09/16 1238) SpO2:  [99 %] 99 % (09/16 1238) Weight:  [59.6 kg] 59.6 kg (09/16 1238) General:  Alert and oriented, No acute distress HEENT: Normocephalic, atraumatic Neck: No JVD or lymphadenopathy Cardiovascular: Regular rate  Lungs: Normal inspiratory/expiratory excursion Abdomen: Soft, nontender, nondistended, no abdominal masses Back: No CVA tenderness Extremities: No edema Neurologic: Grossly intact  Laboratory Data:  Results for orders placed or performed during the hospital encounter of 09/08/20 (from the past 24 hour(s))  I-STAT, chem 8     Status: Abnormal   Collection Time: 09/08/20 12:23 PM  Result Value Ref Range   Sodium 145 135 - 145  mmol/L   Potassium 4.5 3.5 - 5.1 mmol/L   Chloride 105 98 - 111 mmol/L   BUN 29 (H) 8 - 23 mg/dL   Creatinine, Ser 1.00 0.44 - 1.00 mg/dL   Glucose, Bld 97 70 - 99 mg/dL   Calcium, Ion 1.30 1.15 - 1.40 mmol/L   TCO2 29 22 - 32 mmol/L   Hemoglobin 13.9 12.0 - 15.0 g/dL   HCT 41.0 36 -  46 %   Recent Results (from the past 240 hour(s))  SARS CORONAVIRUS 2 (TAT 6-24 HRS) Nasopharyngeal Nasopharyngeal Swab     Status: None   Collection Time: 09/05/20  2:01 PM   Specimen: Nasopharyngeal Swab  Result Value Ref Range Status   SARS Coronavirus 2 NEGATIVE NEGATIVE Final    Comment: (NOTE) SARS-CoV-2 target nucleic acids are NOT DETECTED.  The SARS-CoV-2 RNA is generally detectable in upper and lower respiratory specimens during the acute phase of infection. Negative results do not preclude SARS-CoV-2 infection, do not rule out co-infections with other pathogens, and should not be used as the sole basis for treatment or other patient management decisions. Negative results must be combined with clinical observations, patient history, and epidemiological information. The expected result is Negative.  Fact Sheet for Patients: SugarRoll.be  Fact Sheet for Healthcare Providers: https://www.woods-mathews.com/  This test is not yet approved or cleared by the [place] FDA and  has been authorized for detection and/or diagnosis of SARS-CoV-2 by FDA under an Emergency Use Authorization (EUA). This EUA will remain  in effect (meaning this test can be used) for the duration of the COVID-19 declaration under Se ction 564(b)(1) of the Act, 21 U.S.C. section 360bbb-3(b)(1), unless the authorization is terminated or revoked sooner.  Performed at [facility], [place] [address] [place], [place] [address]    Creatinine: Recent Labs    09/08/20 1223  CREATININE 1.00    Radiologic Imaging: No results found.  Impression/Assessment:  Rt  hydronephrosis  Plan:  Cysto, Rt RGP, possible Rt URS w/ stent  Lillette Boxer Layliana Devins 09/08/2020, 12:46 PM  Lillette Boxer. Linsey Arteaga MD

## 2020-09-08 NOTE — Transfer of Care (Signed)
Immediate Anesthesia Transfer of Care Note  Patient: Alison Mcguire  Procedure(s) Performed: CYSTOSCOPY WITH RETROGRADE PYELOGRAM, URETEROSCOPY, DILATION OF STRICTURE  AND DOUBLE J STENT PLACEMENT (Right Urethra)  Patient Location: PACU  Anesthesia Type:General  Level of Consciousness: awake  Airway & Oxygen Therapy: Patient Spontanous Breathing and Patient connected to face mask oxygen  Post-op Assessment: Report given to RN and Post -op Vital signs reviewed and stable  Post vital signs: Reviewed and stable  Last Vitals:  Vitals Value Taken Time  BP 142/79 09/08/20 1431  Temp    Pulse 87 09/08/20 1432  Resp 14 09/08/20 1432  SpO2 100 % 09/08/20 1432  Vitals shown include unvalidated device data.  Last Pain:  Vitals:   09/08/20 1238  TempSrc: Oral  PainSc: 1       Patients Stated Pain Goal: 4 (00/63/49 4944)  Complications: No complications documented.

## 2020-09-08 NOTE — Interval H&P Note (Signed)
History and Physical Interval Note:  09/08/2020 1:25 PM  Alison Mcguire  has presented today for surgery, with the diagnosis of RIGHT HYDRONEPHROSIS.  The various methods of treatment have been discussed with the patient and family. After consideration of risks, benefits and other options for treatment, the patient has consented to  Procedure(s) with comments: [name], URETEROSCOPY AND STENT PLACEMENT (Right) - 36 MINS as a surgical intervention.  The patient's history has been reviewed, patient examined, no change in status, stable for surgery.  I have reviewed the patient's chart and labs.  Questions were answered to the patient's satisfaction.     [name]

## 2020-09-08 NOTE — Anesthesia Procedure Notes (Signed)
Procedure Name: LMA Insertion Date/Time: 09/08/2020 1:52 PM Performed by: Eulas Post, Khaled Herda W, CRNA Pre-anesthesia Checklist: Patient identified, Emergency Drugs available, Suction available and Patient being monitored Patient Re-evaluated:Patient Re-evaluated prior to induction Oxygen Delivery Method: Circle system utilized Preoxygenation: Pre-oxygenation with 100% oxygen Induction Type: IV induction Ventilation: Mask ventilation without difficulty LMA: LMA inserted LMA Size: 4.0 Number of attempts: 1 Placement Confirmation: positive ETCO2 and breath sounds checked- equal and bilateral Tube secured with: Tape Dental Injury: Teeth and Oropharynx as per pre-operative assessment

## 2020-09-08 NOTE — Discharge Instructions (Signed)
1. You may see some blood in the urine and may have some burning with urination for 48-72 hours. You also may notice that you have to urinate more frequently or urgently after your procedure which is normal.  2. You should call should you develop an inability urinate, fever > 101, persistent nausea and vomiting that prevents you from eating or drinking to stay hydrated.  3. If you have a stent, you will likely urinate more frequently and urgently until the stent is removed and you may experience some discomfort/pain in the lower abdomen and flank especially when urinating. You may take pain medication prescribed to you if needed for pain. You may also intermittently have blood in the urine until the stent is removed.  [facility] [phone] Post Ureteroscopy With or Without Stent Instructions  Definitions:  Ureter: The duct that transports urine from the kidney to the bladder. Stent:   A plastic hollow tube that is placed into the ureter, from the kidney to the bladder to prevent the ureter from swelling shut.  GENERAL INSTRUCTIONS:  Despite the fact that no skin incisions were used, the area around the ureter and bladder is raw and irritated. The stent is a foreign body which will further irritate the bladder wall. This irritation is manifested by increased frequency of urination, both day and night, and by an increase in the urge to urinate. In some, the urge to urinate is present almost always. Sometimes the urge is strong enough that you may not be able to stop yourself from urinating. The only real cure is to remove the stent and then give time for the bladder wall to heal which can't be done until the danger of the ureter swelling shut has passed, which varies.  You may see some blood in your urine while the stent is in place and a few days afterwards. Do not be alarmed, even if the urine was clear for a while. Get off your feet and drink lots of fluids until clearing  occurs. If you start to pass clots or don't improve, call us.  DIET: You may return to your normal diet immediately. Because of the raw surface of your bladder, alcohol, spicy foods, acid type foods and drinks with caffeine may cause irritation or frequency and should be used in moderation. To keep your urine flowing freely and to avoid constipation, drink plenty of fluids during the day ( 8-10 glasses ). Tip: Avoid cranberry juice because it is very acidic.  ACTIVITY: Your physical activity doesn't need to be restricted. However, if you are very active, you may see some blood in your urine. We suggest that you reduce your activity under these circumstances until the bleeding has stopped.  BOWELS: It is important to keep your bowels regular during the postoperative period. Straining with bowel movements can cause bleeding. A bowel movement every other day is reasonable. Use a mild laxative if needed, such as Milk of Magnesia 2-3 tablespoons, or 2 Dulcolax tablets. Call if you continue to have problems. If you have been taking narcotics for pain, before, during or after your surgery, you may be constipated. Take a laxative if necessary.   MEDICATION: You should resume your pre-surgery medications unless told not to. In addition you will often be given an antibiotic to prevent infection. These should be taken as prescribed until the bottles are finished unless you are having an unusual reaction to one of the drugs.  PROBLEMS YOU SHOULD REPORT TO [place]:  Fevers over  100.5 Fahrenheit.  Heavy bleeding, or clots ( See above notes about blood in urine ).  Inability to urinate.  Drug reactions ( hives, rash, nausea, vomiting, diarrhea ).  Severe burning or pain with urination that is not improving.  FOLLOW-UP: You will need a follow-up appointment to monitor your progress. Call for this appointment at the number listed above. Usually the first appointment will be about three to fourteen days after  your surgery.        Post Anesthesia Home Care Instructions  Activity: Get plenty of rest for the remainder of the day. A responsible adult should stay with you for 24 hours following the procedure.  For the next 24 hours, DO NOT: -Drive a car -Paediatric nurse -Drink alcoholic beverages -Take any medication unless instructed by your physician -Make any legal decisions or sign important papers.  Meals: Start with liquid foods such as gelatin or soup. Progress to regular foods as tolerated. Avoid greasy, spicy, heavy foods. If nausea and/or vomiting occur, drink only clear liquids until the nausea and/or vomiting subsides. Call your physician if vomiting continues.  Special Instructions/Symptoms: Your throat may feel dry or sore from the anesthesia or the breathing tube placed in your throat during surgery. If this causes discomfort, gargle with warm salt water. The discomfort should disappear within 24 hours.  If you had a scopolamine patch placed behind your ear for the management of post- operative nausea and/or vomiting:  1. The medication in the patch is effective for 72 hours, after which it should be removed.  Wrap patch in a tissue and discard in the trash. Wash hands thoroughly with soap and water. 2. You may remove the patch earlier than 72 hours if you experience unpleasant side effects which may include dry mouth, dizziness or visual disturbances. 3. Avoid touching the patch. Wash your hands with soap and water after contact with the patch.

## 2020-09-08 NOTE — Anesthesia Postprocedure Evaluation (Signed)
Anesthesia Post Note  Patient: Alison Mcguire  Procedure(s) Performed: CYSTOSCOPY WITH RETROGRADE PYELOGRAM, URETEROSCOPY, DILATION OF STRICTURE  AND DOUBLE J STENT PLACEMENT (Right Urethra)     Patient location during evaluation: PACU Anesthesia Type: General Level of consciousness: awake and alert Pain management: pain level controlled Vital Signs Assessment: post-procedure vital signs reviewed and stable Respiratory status: spontaneous breathing, nonlabored ventilation, respiratory function stable and patient connected to nasal cannula oxygen Cardiovascular status: blood pressure returned to baseline and stable Postop Assessment: no apparent nausea or vomiting Anesthetic complications: no   No complications documented.  Last Vitals:  Vitals:   09/08/20 1445 09/08/20 1447  BP: (!) 202/185 135/71  Pulse: 90 85  Resp: (!) 21 (!) 32  Temp:    SpO2: 99% 100%    Last Pain:  Vitals:   09/08/20 1430  TempSrc:   PainSc: 0-No pain                 Belenda Cruise P Jennet Scroggin

## 2020-09-08 NOTE — Op Note (Signed)
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  As part of evaluation she underwent a renal ultrasound which revealed significant right hydronephrosis.  No stones were seen.  CT scan with and without contrast revealed significant right hydronephrosis, sharp cut off/probable stricture in the proximal ureter, no stone disease.  No extrinsic abnormalities along the ureters.  She presents at this time for cystoscopy, right retrograde, ureteroscopy, possible dilation and stent placement.  I discussed the procedure with the patient.  Risks and complications have been discussed as well.  She understands and desires to proceed.  Description of procedure: The patient was properly identified and marked in the holding area.  She received preoperative IV antibiotics and was taken to the operating room where general anesthetic was administered with the LMA.  She was placed in the dorsolithotomy position.  Genitalia and perineum were prepped and draped, proper timeout performed.  4 French panendoscope was advanced into the bladder.  Systematic inspection of the bladder was performed.  Urothelium was normal.  Ureteral orifice ease were normally placed and configured.  The right ureteral orifice was cannulated with a 6 open-ended catheter and using Omnipaque gentle right retrograde ureteropyelogram was performed.  This revealed a normal mid and distal ureter.  Proximally, there was a very  narrowed area in the area of the right ureteropelvic junction.  This was quite tight but did allow contrast to pass into a significantly dilated pyelocalyceal system.  I did not notice any filling defects within this.  Following filling, there was no significant drainage into the ureter.  At this point, the open-ended catheter was removed after a sensor tip guidewire was advanced through the ureter and into the right upper pole calyceal system.  I then removed the cystoscope.  Ureteroscope was advanced up the ureter to the area of the stricture.  There was a pinpoint opening, just enough to allow the previously mentioned sensor tip guidewire in.  I could not negotiate a second guidewire into the stricture.  The scope was then removed.  I then passed a ureteral balloon dilator, 15 French, 10 cm long.  This was somewhat difficult to pass across the stricture.  However, once I identified that this passed fluoroscopically, the balloon was dilated to 14 atm for approximately 5 minutes.  At this point it was deflated and removed over top of the guidewire which was left indwelling.  I then negotiated the same 6 French dual-lumen semirigid ureteroscope up to the area of the stricture which was wide open.  It was easily passed into the renal pelvis.  There was some bloody urine within the renal pelvis, most likely from instrumentation.  I did take washings from the renal pelvis and from the ureter just near the strictured area and sent these for cytology.  There was no evidence of urothelial abnormality either proximal distal to this stricture.  It was somewhat ragged looking from the dilation, but I am not suspicious that there was neoplasm in this area.  At this point, the ureteroscope was removed.  The guidewire was backloaded through the cystoscope and then a 24 cm  x 6 French contour double-J stent was placed, with excellent proximal and distal curl seen using fluoroscopy and cystoscopy once the guidewire was removed.   At this point the bladder was drained, the scope removed, the procedure terminated.  The patient was awakened, taken to the PACU in stable condition.  She tolerated the procedure well.

## 2020-09-09 ENCOUNTER — Encounter (HOSPITAL_BASED_OUTPATIENT_CLINIC_OR_DEPARTMENT_OTHER): Payer: Self-pay | Admitting: Urology

## 2020-09-09 LAB — CYTOLOGY - NON PAP

## 2021-04-17 ENCOUNTER — Other Ambulatory Visit: Payer: Self-pay

## 2021-04-17 ENCOUNTER — Ambulatory Visit
Admission: EM | Admit: 2021-04-17 | Discharge: 2021-04-17 | Disposition: A | Discharge status: Home / Self Care | Payer: Medicare Other | Attending: Emergency Medicine | Admitting: Emergency Medicine

## 2021-04-17 DIAGNOSIS — R21 Rash and other nonspecific skin eruption: Secondary | ICD-10-CM | POA: Insufficient documentation

## 2021-04-17 DIAGNOSIS — N390 Urinary tract infection, site not specified: Secondary | ICD-10-CM | POA: Insufficient documentation

## 2021-04-17 LAB — POCT URINALYSIS DIP (MANUAL ENTRY)

## 2021-04-17 MED ORDER — VALACYCLOVIR HCL 1 G PO TABS: 1000.0000 mg | ORAL_TABLET | Freq: Three times a day (TID) | ORAL | 0 refills | Status: AC

## 2021-04-17 MED ORDER — CEPHALEXIN 500 MG PO CAPS: 500.0000 mg | ORAL_CAPSULE | Freq: Two times a day (BID) | ORAL | 0 refills | Status: AC

## 2021-04-17 MED ORDER — TRIAMCINOLONE ACETONIDE 0.1 % EX CREA: 1.0000 "application " | TOPICAL_CREAM | Freq: Two times a day (BID) | CUTANEOUS | 0 refills | Status: AC

## 2021-04-17 NOTE — Discharge Instructions (Addendum)
Begin Keflex twice daily x2 weeks Valtrex 3 times daily x10 days Triamcinolone twice daily to rash Drink plenty of fluids Follow-up if not improving or worsening

## 2021-04-17 NOTE — ED Notes (Signed)
Due to quality of urine, Clintek machine unable to process urine. Provider is aware. Urine culture ordered.

## 2021-04-17 NOTE — ED Triage Notes (Signed)
Pt c/o pus filled red itchy rash to lt forearm since Friday. C/o red hot rash to lt side and now to cheeks since yesterday. Pt c/o UTI sx's for over 2 months, has had multiple tx's with some relief.

## 2021-04-18 NOTE — ED Provider Notes (Signed)
[facility]    CSN: [id number] Arrival date & time: 04/17/21  1626      History   Chief Complaint Chief Complaint  Patient presents with  . Rash  . Recurrent UTI    HPI Alison Mcguire is a 80 y.o. female history of GERD, recurrent UTI presenting today for evaluation of UTI and rash.  Patient reports that she has had constant UTI symptoms over the past 2 months, recently with worsening urinary odor and cloudiness.  She denies any fevers nausea or vomiting.  Sees urology for this.   Also reports rash to side arm and face.  Rash initially developed to left side with itching and burning sensation.  She has also developed small little "pus filled" blisters to her left forearm.  Patient shows me pictures on her phone which appear more fluid-filled vesicles on erythematous base.  Areas with associated itching as well.  Denies any exposure to plants/woods.  Initially concerned about bedbugs, had exterminator come out to the house and did not find any pests.  She reports increased stress recently, does admit to some night sweats and weight loss.  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  Reports physical this past January with normal blood work.  HPI  Past Medical History:  Diagnosis Date  . Anxiety   . Depression   . Fibrocystic breast   . GERD (gastroesophageal reflux disease)   . History of anemia   . History of lower GI bleeding 05/2009   s/p colonoscopy w/ endoclip of bleed  . History of traumatic injury of head    per pt at age 85 or 16 fell from tree , skull fracture with loc, no surgery, not in coma,  recovered with residual migraines  . Hydronephrosis, right   . Hyperlipidemia   . Hypertension   . OA (osteoarthritis)   . Seasonal allergies   . SUI (stress urinary incontinence, female)   . Vestibular migraine    hx complicated migraine    Patient Active Problem List   Diagnosis Date Noted  . Complicated migraine 43/32/9518  .  Overactive bladder 08/31/2017  . Hair loss 08/31/2017  . Transient vision disturbance   . Colitis 07/28/2012  . Bloody diarrhea 07/28/2012  . Abdominal pain 07/28/2012  . GERD (gastroesophageal reflux disease) 07/28/2012    Past Surgical History:  Procedure Laterality Date  . BREAST IMPLANT EXCHANGE Bilateral x3 one side and x1 on the other side ,  last surgery approx. 1990s  . COLONOSCOPY    . CYSTOSCOPY WITH RETROGRADE PYELOGRAM, URETEROSCOPY AND STENT PLACEMENT Right 09/08/2020   Procedure: CYSTOSCOPY WITH RETROGRADE PYELOGRAM, URETEROSCOPY, DILATION OF STRICTURE  AND DOUBLE J STENT PLACEMENT;  Surgeon: Franchot Gallo, MD;  Location: [facility];  Service: Urology;  Laterality: Right;  . EXPLORATORY THORACOTOMY/ RESECTION ESOPHAGEAL DIVERTICULUM/ ESOPHAGEAL MYOTOMY/  BELSEY MARK IV FUNDOPLICATION Left [phone]   DR BURNEY @[facility]   . SIMPLE MASTECTOMY Bilateral 1980s   per pt done for atypical cells, had fibrocystic breast  . TONSILLECTOMY AND ADENOIDECTOMY  1970s  . VAGINAL HYSTERECTOMY  1970s    OB History   No obstetric history on file.      Home Medications    Prior to Admission medications   Medication Sig Start Date End Date Taking? Authorizing Provider  cephALEXin (KEFLEX) 500 MG capsule Take 1 capsule (500 mg total) by mouth 2 (two) times daily for 14 days. 04/17/21 05/01/21 Yes Darnelle Corp C, PA-C  triamcinolone  cream (KENALOG) 0.1 % Apply 1 application topically 2 (two) times daily. 04/17/21  Yes Cobe Viney C, PA-C  valACYclovir (VALTREX) 1000 MG tablet Take 1 tablet (1,000 mg total) by mouth 3 (three) times daily for 10 days. 04/17/21 04/27/21 Yes Shawntrice Salle C, PA-C  acetaminophen (TYLENOL) 500 MG tablet Take 500 mg by mouth every 8 (eight) hours as needed.    [provider]  ALPRAZolam Duanne Moron) 0.5 MG tablet Take 1 mg by mouth at bedtime.     [provider]  Biotin 5000 MCG CAPS Take 1 capsule by mouth at bedtime.     [provider]  Calcium Carb-Cholecalciferol (CALCIUM 600+D3 PO) Take 1 tablet by mouth in the morning and at bedtime.    [provider]  calcium carbonate (OSCAL) 1500 (600 Ca) MG TABS tablet Take by mouth 2 (two) times daily with a meal.    [provider]  Cranberry-Vitamin C (AZO CRANBERRY URINARY TRACT) 250-60 MG CAPS Take by mouth.    [provider]  Ginkgo Biloba 40 MG TABS Take by mouth daily.    [provider]  GLUCOS-CHONDROIT-HYALURON-MSM PO Take 1-2 tablets by mouth in the morning and at bedtime.     [provider]  Glycerin-Hypromellose-PEG 400 (DRY EYE RELIEF DROPS OP) Apply to eye as needed.    [provider]  loratadine (CLARITIN) 10 MG tablet Take 10 mg by mouth daily.    [provider]  meloxicam (MOBIC) 15 MG tablet Take 15 mg by mouth daily.    [provider]  metoprolol succinate (TOPROL-XL) 50 MG 24 hr tablet Take 50 mg by mouth at bedtime.  12/08/19   [provider]  Multiple Vitamins-Minerals (HAIR SKIN NAILS PO) Take by mouth daily.    [provider]  Multiple Vitamins-Minerals (MULTIVITAMIN WITH MINERALS) tablet Take 1 tablet by mouth daily.    [provider]  multivitamin-lutein (OCUVITE-LUTEIN) CAPS capsule Take 1 capsule by mouth daily.    [provider]  olopatadine (PATADAY) 0.1 % ophthalmic solution 1 drop 2 (two) times daily as needed for allergies.    [provider]  Omega-3 Fatty Acids (FISH OIL PO) Take by mouth daily.    [provider]  omeprazole (PRILOSEC) 20 MG capsule Take 20 mg by mouth daily.    [provider]  pravastatin (PRAVACHOL) 20 MG tablet Take 20 mg by mouth at bedtime.  12/22/19   [provider]  tolterodine (DETROL LA) 4 MG 24 hr capsule Take 4 mg by mouth daily.  11/29/19   [provider]  Vitamin D, Ergocalciferol, (DRISDOL) 50000 UNITS CAPS Take 50,000 Units by mouth  every 7 (seven) days. Every Wednesday    [provider]  Zinc 50 MG CAPS Take 1 capsule by mouth at bedtime.     [provider]    Family History Family History  Problem Relation Age of Onset  . Hypertension Sister   . Diabetes Son   . Stroke Sister   . Diabetes Sister     Social History Social History   Tobacco Use  . Smoking status: Never Smoker  . Smokeless tobacco: Never Used  Vaping Use  . Vaping Use: Never used  Substance Use Topics  . Alcohol use: No  . Drug use: Never     Allergies   Codeine, Ambien [zolpidem], Aspirin, Botox [botulinum toxin type a], Clinoril [sulindac], Demerol [meperidine], Dilaudid [hydromorphone hcl], Ether, Flagyl [metronidazole], Penicillins, Soma [carisoprodol], Topamax [topiramate],  Adhesive [tape], Azithromycin, Betadine [povidone iodine], and Nickel   Review of Systems Review of Systems  Constitutional: Negative for fatigue and fever.  Eyes: Negative for visual disturbance.  Respiratory: Negative for shortness of breath.   Cardiovascular: Negative for chest pain.  Gastrointestinal: Negative for abdominal pain, nausea and vomiting.  Musculoskeletal: Negative for arthralgias and joint swelling.  Skin: Positive for color change and rash. Negative for wound.  Neurological: Negative for dizziness, weakness, light-headedness and headaches.     Physical Exam Triage Vital Signs ED Triage Vitals  Enc Vitals Group     BP 04/17/21 1850 (!) 165/97     Pulse Rate 04/17/21 1850 75     Resp 04/17/21 1850 18     Temp 04/17/21 1850 97.9 F (36.6 C)     Temp Source 04/17/21 1850 Oral     SpO2 04/17/21 1850 96 %     Weight --      Height --      Head Circumference --      Peak Flow --      Pain Score 04/17/21 1851 2     Pain Loc --      Pain Edu? --      Excl. in [place]? --    No data found.  Updated Vital Signs BP (!) 165/97 (BP Location: Left Arm)   Pulse 75   Temp 97.9 F (36.6 C) (Oral)   Resp 18   LMP  07/28/2012   SpO2 96%   Visual Acuity Right Eye Distance:   Left Eye Distance:   Bilateral Distance:    Right Eye Near:   Left Eye Near:    Bilateral Near:     Physical Exam Vitals and nursing note reviewed.  Constitutional:      Appearance: She is well-developed.     Comments: No acute distress  HENT:     Head: Normocephalic and atraumatic.     Nose: Nose normal.  Eyes:     Conjunctiva/sclera: Conjunctivae normal.  Cardiovascular:     Rate and Rhythm: Normal rate.  Pulmonary:     Effort: Pulmonary effort is normal. No respiratory distress.  Abdominal:     General: There is no distension.  Musculoskeletal:        General: Normal range of motion.     Cervical back: Neck supple.  Skin:    General: Skin is warm and dry.     Comments: Left flank with large erythematous patch without associated vesicles, does not cross midline, no extension to back  Left forearm with multiple slightly linear erythematous areas, no longer with elevated vesicles/pustules  Right maxillary area with slight increase in erythema, no induration  Neurological:     Mental Status: She is alert and oriented to person, place, and time.      UC Treatments / Results  Labs (all labs ordered are listed, but only abnormal results are displayed) Labs Reviewed  URINE CULTURE  POCT URINALYSIS DIP (MANUAL ENTRY)    EKG   Radiology No results found.  Procedures Procedures (including critical care time)  Medications Ordered in UC Medications - No data to display  Initial Impression / Assessment and Plan / UC Course  I have reviewed the triage vital signs and the nursing notes.  Pertinent labs & imaging results that were available during my care of the patient were reviewed by me and considered in my medical decision making (see chart for details).  Clinical Course as of 04/18/21 0800  Mon  Apr 17, 2021  1936 138/83 [HW]    Clinical Course User Index [HW] Lihanna Biever C, PA-C    1.   UTI- urine unable to run due to quality of urine for UA in clinic, will send for urine culture.  Starting on Keflex twice daily x2 weeks, discussed refollowing up with urology for further recommendations and evaluations regarding any maintenance/prevention treatment of persistent UTIs.  2.  Rash- slightly concerning for shingles given distribution on left side, but given rash also on arm is not isolated to specific dermatome, possible atypical presentation of shingles versus contact dermatitis.  Opting to initiate on Valtrex and provided triamcinolone to apply topically with close monitoring.  Deferring oral steroids at this time.  Discussed strict return precautions. Patient verbalized understanding and is agreeable with plan.  Final Clinical Impressions(s) / UC Diagnoses   Final diagnoses:  Lower urinary tract infection, acute  Rash and nonspecific skin eruption     Discharge Instructions     Begin Keflex twice daily x2 weeks Valtrex 3 times daily x10 days Triamcinolone twice daily to rash Drink plenty of fluids Follow-up if not improving or worsening    ED Prescriptions    Medication Sig Dispense Auth. Provider   cephALEXin (KEFLEX) 500 MG capsule Take 1 capsule (500 mg total) by mouth 2 (two) times daily for 14 days. 28 capsule Zain Bingman C, PA-C   valACYclovir (VALTREX) 1000 MG tablet Take 1 tablet (1,000 mg total) by mouth 3 (three) times daily for 10 days. 30 tablet Niki Payment C, PA-C   triamcinolone cream (KENALOG) 0.1 % Apply 1 application topically 2 (two) times daily. 80 g Shea Swalley, [place] C, PA-C     PDMP not reviewed this encounter.   [place]ra Takacs, [place] C, PA-C 04/18/21 0800

## 2021-04-20 LAB — URINE CULTURE: Culture: >=100000 — AB

## 2021-08-05 ENCOUNTER — Other Ambulatory Visit: Payer: Self-pay

## 2021-08-05 ENCOUNTER — Ambulatory Visit
Admission: RE | Admit: 2021-08-05 | Discharge: 2021-08-05 | Disposition: A | Discharge status: Home / Self Care | Payer: Medicare Other | Source: Ambulatory Visit | Attending: Internal Medicine | Admitting: Internal Medicine

## 2021-08-05 VITALS — BP 136/74 | HR 79 | Temp 98.2°F | Resp 18

## 2021-08-05 DIAGNOSIS — R112 Nausea with vomiting, unspecified: Secondary | ICD-10-CM | POA: Diagnosis not present

## 2021-08-05 DIAGNOSIS — N390 Urinary tract infection, site not specified: Secondary | ICD-10-CM | POA: Diagnosis present

## 2021-08-05 DIAGNOSIS — R1084 Generalized abdominal pain: Secondary | ICD-10-CM | POA: Diagnosis not present

## 2021-08-05 DIAGNOSIS — R197 Diarrhea, unspecified: Secondary | ICD-10-CM | POA: Insufficient documentation

## 2021-08-05 DIAGNOSIS — A084 Viral intestinal infection, unspecified: Secondary | ICD-10-CM | POA: Diagnosis not present

## 2021-08-05 LAB — POCT RAPID STREP A (OFFICE): Rapid Strep A Screen: NEGATIVE

## 2021-08-05 MED ORDER — ONDANSETRON 4 MG PO TBDP
4.0000 mg | ORAL_TABLET | Freq: Once | ORAL | Status: AC
  Administered 2021-08-05: 4 mg via ORAL

## 2021-08-05 MED ORDER — ONDANSETRON HCL 4 MG PO TABS: 4.0000 mg | ORAL_TABLET | Freq: Four times a day (QID) | ORAL | 0 refills | Status: AC | PRN

## 2021-08-05 NOTE — ED Provider Notes (Signed)
[facility]    CSN: [id number] Arrival date & time: 08/05/21  1253      History   Chief Complaint Chief Complaint  Patient presents with   Appointment    1300   Abdominal Pain   Weakness    HPI Alison Mcguire is a 80 y.o. female.   Patient presents with nausea, vomiting, diarrhea, abdominal pain that started approximately 1 week ago.  Vomiting and diarrhea have now resolved but nausea and abdominal pain are persistent per patient.  Has also had decreased appetite and generalized weakness.  Patient has been able to keep food and fluids down as well.  Took an at home COVID-19 test when symptoms first started and it was negative.  Denies any upper respiratory symptoms, headache, chest pain, shortness of breath, blurred vision, dizziness.  Patient reports that she does have frequent urinary tract infections but denies any urinary burning or frequency at this time.  Has an appointment with urologist at the end of the month.  Last urinary tract infection was 3 months ago per patient.  Patient states that she has also had right lower quadrant pain that has been present for approximately 6 months.  States that she did noticed a small amount of bright red blood one time symptoms for started.  Denies noticing any hemorrhoids.   Abdominal Pain Weakness  Past Medical History:  Diagnosis Date   Anxiety    Depression    Fibrocystic breast    GERD (gastroesophageal reflux disease)    History of anemia    History of lower GI bleeding 05/2009   s/p colonoscopy w/ endoclip of bleed   History of traumatic injury of head    per pt at age 29 or 69 fell from tree , skull fracture with loc, no surgery, not in coma,  recovered with residual migraines   Hydronephrosis, right    Hyperlipidemia    Hypertension    OA (osteoarthritis)    Seasonal allergies    SUI (stress urinary incontinence, female)    Vestibular migraine    hx complicated migraine    Patient Active Problem List    Diagnosis Date Noted   Complicated migraine [id number]   Overactive bladder 08/31/2017   Hair loss 08/31/2017   Transient vision disturbance    Colitis 07/28/2012   Bloody diarrhea 07/28/2012   Abdominal pain 07/28/2012   GERD (gastroesophageal reflux disease) 07/28/2012    Past Surgical History:  Procedure Laterality Date   BREAST IMPLANT EXCHANGE Bilateral x3 one side and x1 on the other side ,  last surgery approx. 1990s   COLONOSCOPY     CYSTOSCOPY WITH RETROGRADE PYELOGRAM, URETEROSCOPY AND STENT PLACEMENT Right 09/08/2020   Procedure: CYSTOSCOPY WITH RETROGRADE PYELOGRAM, URETEROSCOPY, DILATION OF STRICTURE  AND DOUBLE J STENT PLACEMENT;  Surgeon: Franchot Gallo, MD;  Location: [facility];  Service: Urology;  Laterality: Right;   EXPLORATORY THORACOTOMY/ RESECTION ESOPHAGEAL DIVERTICULUM/ ESOPHAGEAL MYOTOMY/  BELSEY MARK IV FUNDOPLICATION Left [id number]   DR Arlyce Dice '@[facility]    SIMPLE MASTECTOMY Bilateral 1980s   per pt done for atypical cells, had fibrocystic breast   TONSILLECTOMY AND ADENOIDECTOMY  1970s   VAGINAL HYSTERECTOMY  1970s    OB History   No obstetric history on file.      Home Medications    Prior to Admission medications   Medication Sig Start Date End Date Taking? Authorizing Provider  ondansetron (ZOFRAN) 4 MG tablet Take 1 tablet (4 mg total) by mouth every 6 (  six) hours as needed for nausea or vomiting. 08/05/21  Yes Odis Luster, FNP  acetaminophen (TYLENOL) 500 MG tablet Take 500 mg by mouth every 8 (eight) hours as needed.    [provider]  ALPRAZolam Duanne Moron) 0.5 MG tablet Take 1 mg by mouth at bedtime.     [provider]  Biotin 5000 MCG CAPS Take 1 capsule by mouth at bedtime.    [provider]  Calcium Carb-Cholecalciferol (CALCIUM 600+D3 PO) Take 1 tablet by mouth in the morning and at bedtime.    [provider]  calcium carbonate (OSCAL) 1500 (600 Ca) MG TABS tablet Take by mouth 2  (two) times daily with a meal.    [provider]  Cranberry-Vitamin C (AZO CRANBERRY URINARY TRACT) 250-60 MG CAPS Take by mouth.    [provider]  Ginkgo Biloba 40 MG TABS Take by mouth daily.    [provider]  GLUCOS-CHONDROIT-HYALURON-MSM PO Take 1-2 tablets by mouth in the morning and at bedtime.     [provider]  Glycerin-Hypromellose-PEG 400 (DRY EYE RELIEF DROPS OP) Apply to eye as needed.    [provider]  loratadine (CLARITIN) 10 MG tablet Take 10 mg by mouth daily.    [provider]  meloxicam (MOBIC) 15 MG tablet Take 15 mg by mouth daily.    [provider]  metoprolol succinate (TOPROL-XL) 50 MG 24 hr tablet Take 50 mg by mouth at bedtime.  12/08/19   [provider]  Multiple Vitamins-Minerals (HAIR SKIN NAILS PO) Take by mouth daily.    [provider]  Multiple Vitamins-Minerals (MULTIVITAMIN WITH MINERALS) tablet Take 1 tablet by mouth daily.    [provider]  multivitamin-lutein (OCUVITE-LUTEIN) CAPS capsule Take 1 capsule by mouth daily.    [provider]  olopatadine (PATADAY) 0.1 % ophthalmic solution 1 drop 2 (two) times daily as needed for allergies.    [provider]  Omega-3 Fatty Acids (FISH OIL PO) Take by mouth daily.    [provider]  omeprazole (PRILOSEC) 20 MG capsule Take 20 mg by mouth daily.    [provider]  pravastatin (PRAVACHOL) 20 MG tablet Take 20 mg by mouth at bedtime.  12/22/19   [provider]  tolterodine (DETROL LA) 4 MG 24 hr capsule Take 4 mg by mouth daily.  11/29/19   [provider]  triamcinolone cream (KENALOG) 0.1 % Apply 1 application topically 2 (two) times daily. 04/17/21   Wieters, Hallie C, PA-C  Vitamin D, Ergocalciferol, (DRISDOL) 50000 UNITS CAPS Take 50,000 Units by mouth every 7 (seven) days. Every Wednesday    [provider]  Zinc 50 MG CAPS Take 1 capsule by mouth  at bedtime.     [provider]    Family History Family History  Problem Relation Age of Onset   Hypertension Sister    Diabetes Son    Stroke Sister    Diabetes Sister     Social History Social History   Tobacco Use   Smoking status: Never   Smokeless tobacco: Never  Vaping Use   Vaping Use: Never used  Substance Use Topics   Alcohol use: No   Drug use: Never     Allergies   Codeine, Ambien [zolpidem], Aspirin, Botox [botulinum toxin type a], Clinoril [sulindac], Demerol [meperidine], Dilaudid [hydromorphone hcl], Ether, Flagyl [metronidazole], Penicillins, Soma [carisoprodol], Topamax [topiramate], Adhesive [tape], Azithromycin, Betadine [povidone iodine], and Nickel   Review of Systems  Review of Systems Per HPI  Physical Exam Triage Vital Signs ED Triage Vitals [08/05/21 1339]  Enc Vitals Group     BP 136/74     Pulse Rate 79     Resp 18     Temp 98.2 F (36.8 C)     Temp Source Oral     SpO2 97 %     Weight      Height      Head Circumference      Peak Flow      Pain Score 3     Pain Loc      Pain Edu?      Excl. in [place]?    No data found.  Updated Vital Signs BP 136/74 (BP Location: Left Arm)   Pulse 79   Temp 98.2 F (36.8 C) (Oral)   Resp 18   LMP 07/28/2012   SpO2 97%   Visual Acuity Right Eye Distance:   Left Eye Distance:   Bilateral Distance:    Right Eye Near:   Left Eye Near:    Bilateral Near:     Physical Exam Constitutional:      General: She is not in acute distress.    Appearance: She is not ill-appearing, toxic-appearing or diaphoretic.  HENT:     Head: Normocephalic.     Right Ear: Tympanic membrane and ear canal normal.     Left Ear: Tympanic membrane and ear canal normal.     Nose: Nose normal.     Mouth/Throat:     Lips: Pink.     Mouth: Mucous membranes are moist.     Pharynx: Oropharynx is clear. No posterior oropharyngeal erythema.  Eyes:     Conjunctiva/sclera: Conjunctivae normal.   Cardiovascular:     Rate and Rhythm: Normal rate and regular rhythm.     Pulses: Normal pulses.     Heart sounds: Normal heart sounds.  Pulmonary:     Effort: Pulmonary effort is normal.     Breath sounds: Normal breath sounds.  Abdominal:     General: Abdomen is flat. Bowel sounds are normal. There is no distension.     Palpations: Abdomen is soft. There is no mass.     Tenderness: There is generalized abdominal tenderness. There is no right CVA tenderness, left CVA tenderness or guarding.     Hernia: No hernia is present.     Comments: Tenderness to palpation generalized throughout abdomen.  Musculoskeletal:     Cervical back: Normal range of motion.  Skin:    General: Skin is warm and dry.  Neurological:     General: No focal deficit present.     Mental Status: She is alert and oriented to person, place, and time. Mental status is at baseline.  Psychiatric:        Mood and Affect: Mood normal.        Behavior: Behavior normal.        Thought Content: Thought content normal.        Judgment: Judgment normal.     UC Treatments / Results  Labs (all labs ordered are listed, but only abnormal results are displayed) Labs Reviewed  CULTURE, GROUP A STREP ([place])  URINE CULTURE  NOVEL CORONAVIRUS, NAA  CBC  COMPREHENSIVE METABOLIC PANEL  AMYLASE  LIPASE  POCT URINALYSIS DIP (MANUAL ENTRY)  POCT RAPID STREP A (OFFICE)    EKG   Radiology No results found.  Procedures Procedures (including critical care time)  Medications Ordered in  UC Medications  ondansetron (ZOFRAN-ODT) disintegrating tablet 4 mg (4 mg Oral Given 08/05/21 1411)    Initial Impression / Assessment and Plan / UC Course  I have reviewed the triage vital signs and the nursing notes.  Pertinent labs & imaging results that were available during my care of the patient were reviewed by me and considered in my medical decision making (see chart for details).     Symptoms likely related to viral  gastroenteritis since symptoms are starting to resolve.  Ondansetron administered in urgent care today with relief of nausea.  Ondansetron also sent to pharmacy for patient to take as needed at home for nausea.  COVID-19 test is pending.  Completed rapid strep test in case symptoms could be a result of variation of strep.  Rapid strep test was negative in urgent care.  Throat culture is pending.  CMP, amylase, lipase, CBC also pending to rule out any underlying abnormalities.  Urinalysis not able to be read in urgent care today due to equipment malfunction.  Urine culture is pending.  Advised patient to go to the hospital if symptoms become more severe.  Patient to follow-up with PCP and urologist for further evaluation and management.  Patient does not appear to be dehydrated and is nontoxic-appearing.  Do not think further evaluation at emergency department is necessary at this time.  Advised patient to increase clear oral fluid intake.  Discussed strict return precautions. Patient verbalized understanding and is agreeable with plan.  Final Clinical Impressions(s) / UC Diagnoses   Final diagnoses:  Generalized abdominal pain  Non-intractable vomiting with nausea, unspecified vomiting type  Diarrhea, unspecified type  Viral gastroenteritis  Recurrent urinary tract infection     Discharge Instructions      Strep test was negative in urgent care today.  Throat culture and COVID-19 viral swab are pending.  Urine culture is also pending.  You have been prescribed ondansetron to take as needed for nausea.  Please go the hospital if symptoms significantly worsen.  We will call if there are any abnormalities on the blood work.  It is important that you follow-up with primary care physician for abdominal pain as well.     ED Prescriptions     Medication Sig Dispense Auth. Provider   ondansetron (ZOFRAN) 4 MG tablet Take 1 tablet (4 mg total) by mouth every 6 (six) hours as needed for nausea or  vomiting. 12 tablet Odis Luster, FNP      PDMP not reviewed this encounter.   Odis Luster, FNP 08/05/21 1712

## 2021-08-05 NOTE — Discharge Instructions (Addendum)
Strep test was negative in urgent care today.  Throat culture and COVID-19 viral swab are pending.  Urine culture is also pending.  You have been prescribed ondansetron to take as needed for nausea.  Please go the hospital if symptoms significantly worsen.  We will call if there are any abnormalities on the blood work.  It is important that you follow-up with primary care physician for abdominal pain as well.

## 2021-08-05 NOTE — ED Triage Notes (Signed)
Pt sts multiple episodes of N/V/D one week ago; pt sts still having nausea and abd pain; pt sts some blood noted last week in stool; pt sts no appetite and generalized weakness; pt had neg covid testing; pt sts frequent UTI as well that she see urology for

## 2021-08-06 ENCOUNTER — Encounter (HOSPITAL_COMMUNITY): Payer: Self-pay | Admitting: Emergency Medicine

## 2021-08-06 ENCOUNTER — Emergency Department (HOSPITAL_COMMUNITY): Payer: Medicare Other

## 2021-08-06 ENCOUNTER — Emergency Department (HOSPITAL_COMMUNITY)
Admission: EM | Admit: 2021-08-06 | Discharge: 2021-08-06 | Disposition: A | Discharge status: Home / Self Care | Payer: Medicare Other | Attending: Emergency Medicine | Admitting: Emergency Medicine

## 2021-08-06 DIAGNOSIS — R197 Diarrhea, unspecified: Secondary | ICD-10-CM | POA: Diagnosis not present

## 2021-08-06 DIAGNOSIS — R112 Nausea with vomiting, unspecified: Secondary | ICD-10-CM | POA: Insufficient documentation

## 2021-08-06 DIAGNOSIS — R1084 Generalized abdominal pain: Secondary | ICD-10-CM | POA: Insufficient documentation

## 2021-08-06 DIAGNOSIS — Z79899 Other long term (current) drug therapy: Secondary | ICD-10-CM | POA: Insufficient documentation

## 2021-08-06 DIAGNOSIS — I10 Essential (primary) hypertension: Secondary | ICD-10-CM | POA: Diagnosis not present

## 2021-08-06 DIAGNOSIS — K625 Hemorrhage of anus and rectum: Secondary | ICD-10-CM

## 2021-08-06 LAB — CBC
HCT: 46.3 % — ABNORMAL HIGH (ref 36.0–46.0)
Hematocrit: 45.5 % (ref 34.0–46.6)
Hemoglobin: 14.4 g/dL (ref 11.1–15.9)
Hemoglobin: 14.5 g/dL (ref 12.0–15.0)
MCH: 29.6 pg (ref 26.6–33.0)
MCH: 30.6 pg (ref 26.0–34.0)
MCHC: 31.6 g/dL (ref 31.5–35.7)
MCHC: 31.3 g/dL (ref 30.0–36.0)
MCV: 94 fL (ref 79–97)
MCV: 97.7 fL (ref 80.0–100.0)
Platelets: 272 10*3/uL (ref 150–450)
Platelets: 233 10*3/uL (ref 150–400)
RBC: 4.86 x10E6/uL (ref 3.77–5.28)
RBC: 4.74 MIL/uL (ref 3.87–5.11)
RDW: 12.1 % (ref 11.7–15.4)
RDW: 13 % (ref 11.5–15.5)
WBC: 5.8 10*3/uL (ref 3.4–10.8)
WBC: 9.6 10*3/uL (ref 4.0–10.5)
nRBC: 0 % (ref 0.0–0.2)

## 2021-08-06 LAB — COMPREHENSIVE METABOLIC PANEL
ALT: 10 IU/L (ref 0–32)
ALT: 12 U/L (ref 0–44)
AST: 18 IU/L (ref 0–40)
AST: 30 U/L (ref 15–41)
Albumin/Globulin Ratio: 1.7 (ref 1.2–2.2)
Albumin: 4.3 g/dL (ref 3.7–4.7)
Albumin: 3.8 g/dL (ref 3.5–5.0)
Alkaline Phosphatase: 79 IU/L (ref 44–121)
Alkaline Phosphatase: 72 U/L (ref 38–126)
Anion gap: 10 (ref 5–15)
BUN/Creatinine Ratio: 21 (ref 12–28)
BUN: 20 mg/dL (ref 8–27)
BUN: 23 mg/dL (ref 8–23)
Bilirubin Total: 0.3 mg/dL (ref 0.0–1.2)
CO2: 27 mmol/L (ref 20–29)
CO2: 28 mmol/L (ref 22–32)
Calcium: 10.1 mg/dL (ref 8.7–10.3)
Calcium: 9.7 mg/dL (ref 8.9–10.3)
Chloride: 104 mmol/L (ref 96–106)
Chloride: 104 mmol/L (ref 98–111)
Creatinine, Ser: 0.96 mg/dL (ref 0.57–1.00)
Creatinine, Ser: 1.11 mg/dL — ABNORMAL HIGH (ref 0.44–1.00)
GFR, Estimated: 51 mL/min — ABNORMAL LOW (ref >60)
Globulin, Total: 2.6 g/dL (ref 1.5–4.5)
Glucose, Bld: 108 mg/dL — ABNORMAL HIGH (ref 70–99)
Glucose: 105 mg/dL — ABNORMAL HIGH (ref 65–99)
Potassium: 4.8 mmol/L (ref 3.5–5.2)
Potassium: 5.2 mmol/L — ABNORMAL HIGH (ref 3.5–5.1)
Sodium: 144 mmol/L (ref 134–144)
Sodium: 142 mmol/L (ref 135–145)
Total Bilirubin: 1.2 mg/dL (ref 0.3–1.2)
Total Protein: 6.9 g/dL (ref 6.0–8.5)
Total Protein: 7.1 g/dL (ref 6.5–8.1)
eGFR: 60 mL/min/{1.73_m2} (ref >59)

## 2021-08-06 LAB — URINALYSIS, ROUTINE W REFLEX MICROSCOPIC
Bilirubin Urine: NEGATIVE
Glucose, UA: NEGATIVE mg/dL
Ketones, ur: NEGATIVE mg/dL
Nitrite: NEGATIVE
Protein, ur: NEGATIVE mg/dL
Specific Gravity, Urine: 1.015 (ref 1.005–1.030)
pH: 6 (ref 5.0–8.0)

## 2021-08-06 LAB — URINALYSIS, MICROSCOPIC (REFLEX): WBC, UA: >50 WBC/hpf (ref 0–5)

## 2021-08-06 LAB — LIPASE: Lipase: 29 U/L (ref 14–85)

## 2021-08-06 LAB — LIPASE, BLOOD: Lipase: 29 U/L (ref 11–51)

## 2021-08-06 LAB — SARS-COV-2, NAA 2 DAY TAT

## 2021-08-06 LAB — NOVEL CORONAVIRUS, NAA: SARS-CoV-2, NAA: NOT DETECTED

## 2021-08-06 LAB — AMYLASE: Amylase: 48 U/L (ref 31–110)

## 2021-08-06 MED ORDER — ONDANSETRON HCL 4 MG/2ML IJ SOLN
4.0000 mg | Freq: Once | INTRAMUSCULAR | Status: AC
  Administered 2021-08-06: 4 mg via INTRAVENOUS
  Filled 2021-08-06: qty 2

## 2021-08-06 MED ORDER — CEPHALEXIN 500 MG PO CAPS: 500.0000 mg | ORAL_CAPSULE | Freq: Four times a day (QID) | ORAL | 0 refills | Status: AC

## 2021-08-06 MED ORDER — LACTATED RINGERS IV BOLUS
1000.0000 mL | Freq: Once | INTRAVENOUS | Status: AC
  Administered 2021-08-06: 1000 mL via INTRAVENOUS

## 2021-08-06 MED ORDER — CEPHALEXIN 250 MG PO CAPS
250.0000 mg | ORAL_CAPSULE | Freq: Once | ORAL | Status: AC
  Administered 2021-08-06: 250 mg via ORAL
  Filled 2021-08-06: qty 1

## 2021-08-06 MED ORDER — IOHEXOL 350 MG/ML SOLN
80.0000 mL | Freq: Once | INTRAVENOUS | Status: AC | PRN
  Administered 2021-08-06: 80 mL via INTRAVENOUS

## 2021-08-06 MED ORDER — FOSFOMYCIN TROMETHAMINE 3 G PO PACK
3.0000 g | PACK | Freq: Once | ORAL | Status: AC
  Administered 2021-08-06: 3 g via ORAL
  Filled 2021-08-06: qty 3

## 2021-08-06 MED ORDER — LACTATED RINGERS IV BOLUS
500.0000 mL | Freq: Once | INTRAVENOUS | Status: AC
  Administered 2021-08-06: 500 mL via INTRAVENOUS

## 2021-08-06 NOTE — ED Triage Notes (Signed)
Patient here from home lives alone reporting abd pain, n/v x2 weeks with weakness. Reports bright red blood in stools x2 days.

## 2021-08-06 NOTE — ED Provider Notes (Signed)
  Face-to-face evaluation   History: She presents for evaluation of multiple symptoms including ongoing abdominal pain for 6 months, which worsened in the last week, initially only right-sided lower, now bilateral lower.  She has had multiple episodes of bright red blood per rectum, over the last week.  She has had some occasional diarrhea and mucus along with that.  She denies fever, chills, weakness or dizziness.  She is able to eat some but she is not very hungry.  She has not had a recent colonoscopy.  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  She last had a stent about 6 months ago.  She has ongoing cloudy urine, urinary frequency, and malodorous urine.  Physical exam: Nontoxic, elderly female who is alert and cooperative.  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  Intestinal imaging indicates colitis, nonspecific in nature.  She has multiple allergies.  Her hemoglobin is normal.  Her vital signs are reassuring.  Plan outpatient treatment with cephalexin, after fosfomycin given for possible UTI.  Urine culture ordered.  She has short-term follow-up scheduled with urology and I encouraged her to see a GI doctor.  Medical screening examination/treatment/procedure(s) were conducted as a shared visit with non-physician practitioner(s) and myself.  I personally evaluated the patient during the encounter    Daleen Bo, MD 08/07/21 1029

## 2021-08-06 NOTE — Discharge Instructions (Signed)
I am prescribing Keflex.  Please take this 4 times a day for the next 7 days.  Please follow-up with your urologist at your scheduled appointment in 10 days.  Please also follow-up with your gastroenterologist regarding your symptoms.  If you develop any new or worsening symptoms please come back to the emergency department.  It was a pleasure to meet you.

## 2021-08-06 NOTE — ED Provider Notes (Signed)
[facility] DEPT Provider Note   CSN: [id number] Arrival date & time: 08/06/21  [id number]     History Chief Complaint  Patient presents with   Abdominal Pain   GI Bleeding    Alison Mcguire is a 80 y.o. female.  HPI Patient is a 80 year old female with a history of lower GI bleeding, anemia, hyperlipidemia, hypertension, who presents to the emergency department due to nausea, vomiting, diarrhea.  Patient states her symptoms started initially 8 days ago.  She states she had sudden onset projectile vomiting as well as severe diarrhea.  Her symptoms lasted overnight and begin improving.  She states she has had small episodes of diarrhea intermittently for the past week.  She went to urgent care yesterday and was evaluated and a reassuring work-up.  She then began experiencing worsening nausea, vomiting, diarrhea once again.  She states this morning she has small amount of blood and mucus in her stool as well.  Does report a history of GI bleeding is required transfusions in the past.  States she is experiencing diffuse abdominal pain.  No fevers.  Patient notes having history of chronic UTIs and states that she is being evaluated by a new urologist on Tuesday for this.  Denies taking regular antibiotics for this.    Past Medical History:  Diagnosis Date   Anxiety    Depression    Fibrocystic breast    GERD (gastroesophageal reflux disease)    History of anemia    History of lower GI bleeding 05/2009   s/p colonoscopy w/ endoclip of bleed   History of traumatic injury of head    per pt at age 89 or 54 fell from tree , skull fracture with loc, no surgery, not in coma,  recovered with residual migraines   Hydronephrosis, right    Hyperlipidemia    Hypertension    OA (osteoarthritis)    Seasonal allergies    SUI (stress urinary incontinence, female)    Vestibular migraine    hx complicated migraine    Patient Active Problem List   Diagnosis Date Noted    Complicated migraine [id number]   Overactive bladder 08/31/2017   Hair loss 08/31/2017   Transient vision disturbance    Colitis 07/28/2012   Bloody diarrhea 07/28/2012   Abdominal pain 07/28/2012   GERD (gastroesophageal reflux disease) 07/28/2012    Past Surgical History:  Procedure Laterality Date   BREAST IMPLANT EXCHANGE Bilateral x3 one side and x1 on the other side ,  last surgery approx. 1990s   COLONOSCOPY     CYSTOSCOPY WITH RETROGRADE PYELOGRAM, URETEROSCOPY AND STENT PLACEMENT Right 09/08/2020   Procedure: CYSTOSCOPY WITH RETROGRADE PYELOGRAM, URETEROSCOPY, DILATION OF STRICTURE  AND DOUBLE J STENT PLACEMENT;  Surgeon: Franchot Gallo, MD;  Location: [facility];  Service: Urology;  Laterality: Right;   EXPLORATORY THORACOTOMY/ RESECTION ESOPHAGEAL DIVERTICULUM/ ESOPHAGEAL MYOTOMY/  BELSEY MARK IV FUNDOPLICATION Left [id number]   DR Arlyce Dice '@[facility]    SIMPLE MASTECTOMY Bilateral 1980s   per pt done for atypical cells, had fibrocystic breast   TONSILLECTOMY AND ADENOIDECTOMY  1970s   VAGINAL HYSTERECTOMY  1970s     OB History   No obstetric history on file.     Family History  Problem Relation Age of Onset   Hypertension Sister    Diabetes Son    Stroke Sister    Diabetes Sister     Social History   Tobacco Use   Smoking status: Never   Smokeless tobacco:  Never  Vaping Use   Vaping Use: Never used  Substance Use Topics   Alcohol use: No   Drug use: Never    Home Medications Prior to Admission medications   Medication Sig Start Date End Date Taking? Authorizing Provider  cephALEXin (KEFLEX) 500 MG capsule Take 1 capsule (500 mg total) by mouth 4 (four) times daily for 7 days. 08/06/21 08/13/21 Yes Rayna Sexton, PA-C  acetaminophen (TYLENOL) 500 MG tablet Take 500 mg by mouth every 8 (eight) hours as needed.    [provider]  ALPRAZolam Duanne Moron) 0.5 MG tablet Take 1 mg by mouth at bedtime.     [provider]  Biotin  5000 MCG CAPS Take 1 capsule by mouth at bedtime.    [provider]  Calcium Carb-Cholecalciferol (CALCIUM 600+D3 PO) Take 1 tablet by mouth in the morning and at bedtime.    [provider]  calcium carbonate (OSCAL) 1500 (600 Ca) MG TABS tablet Take by mouth 2 (two) times daily with a meal.    [provider]  Cranberry-Vitamin C (AZO CRANBERRY URINARY TRACT) 250-60 MG CAPS Take by mouth.    [provider]  Ginkgo Biloba 40 MG TABS Take by mouth daily.    [provider]  GLUCOS-CHONDROIT-HYALURON-MSM PO Take 1-2 tablets by mouth in the morning and at bedtime.     [provider]  Glycerin-Hypromellose-PEG 400 (DRY EYE RELIEF DROPS OP) Apply to eye as needed.    [provider]  loratadine (CLARITIN) 10 MG tablet Take 10 mg by mouth daily.    [provider]  meloxicam (MOBIC) 15 MG tablet Take 15 mg by mouth daily.    [provider]  metoprolol succinate (TOPROL-XL) 50 MG 24 hr tablet Take 50 mg by mouth at bedtime.  12/08/19   [provider]  Multiple Vitamins-Minerals (HAIR SKIN NAILS PO) Take by mouth daily.    [provider]  Multiple Vitamins-Minerals (MULTIVITAMIN WITH MINERALS) tablet Take 1 tablet by mouth daily.    [provider]  multivitamin-lutein (OCUVITE-LUTEIN) CAPS capsule Take 1 capsule by mouth daily.    [provider]  olopatadine (PATADAY) 0.1 % ophthalmic solution 1 drop 2 (two) times daily as needed for allergies.    [provider]  Omega-3 Fatty Acids (FISH OIL PO) Take by mouth daily.    [provider]  omeprazole (PRILOSEC) 20 MG capsule Take 20 mg by mouth daily.    [provider]  ondansetron (ZOFRAN) 4 MG tablet Take 1 tablet (4 mg total) by mouth every 6 (six) hours as needed for nausea or vomiting. 08/05/21   Odis Luster, FNP  pravastatin (PRAVACHOL) 20 MG tablet Take 20 mg by mouth at bedtime.  12/22/19    [provider]  tolterodine (DETROL LA) 4 MG 24 hr capsule Take 4 mg by mouth daily.  11/29/19   [provider]  triamcinolone cream (KENALOG) 0.1 % Apply 1 application topically 2 (two) times daily. 04/17/21   Wieters, Hallie C, PA-C  Vitamin D, Ergocalciferol, (DRISDOL) 50000 UNITS CAPS Take 50,000 Units by mouth every 7 (seven) days. Every Wednesday    [provider]  Zinc 50 MG CAPS Take 1 capsule by mouth at bedtime.     [provider]    Allergies    Codeine, Ambien [zolpidem], Aspirin, Botox [botulinum toxin type a], Clinoril [sulindac], Demerol [meperidine], Dilaudid [hydromorphone hcl], Ether, Flagyl [metronidazole], Penicillins, Soma [carisoprodol], Topamax [topiramate], Adhesive [tape], Azithromycin,  Betadine [povidone iodine], and Nickel  Review of Systems   Review of Systems  All other systems reviewed and are negative. Ten systems reviewed and are negative for acute change, except as noted in the HPI.   Physical Exam Updated Vital Signs BP 127/73   Pulse 75   Temp 99 F (37.2 C) (Oral)   Resp 18   LMP 07/28/2012   SpO2 99%   Physical Exam Vitals and nursing note reviewed.  Constitutional:      General: She is not in acute distress.    Appearance: Normal appearance. She is not ill-appearing, toxic-appearing or diaphoretic.  HENT:     Head: Normocephalic and atraumatic.     Right Ear: External ear normal.     Left Ear: External ear normal.     Nose: Nose normal.     Mouth/Throat:     Mouth: Mucous membranes are moist.     Pharynx: Oropharynx is clear. No oropharyngeal exudate or posterior oropharyngeal erythema.  Eyes:     Extraocular Movements: Extraocular movements intact.  Cardiovascular:     Rate and Rhythm: Normal rate and regular rhythm.     Pulses: Normal pulses.     Heart sounds: Normal heart sounds. No murmur heard.   No friction rub. No gallop.  Pulmonary:     Effort: Pulmonary effort is normal. No respiratory  distress.     Breath sounds: Normal breath sounds. No stridor. No wheezing, rhonchi or rales.  Abdominal:     General: Abdomen is flat.     Palpations: Abdomen is soft.     Tenderness: There is generalized abdominal tenderness.     Comments: Abdomen is flat and soft.  Moderate tenderness noted diffusely across the abdomen.  No focal tenderness appreciated.  Musculoskeletal:        General: Normal range of motion.     Cervical back: Normal range of motion and neck supple. No tenderness.  Skin:    General: Skin is warm and dry.  Neurological:     General: No focal deficit present.     Mental Status: She is alert and oriented to person, place, and time.  Psychiatric:        Mood and Affect: Mood normal.        Behavior: Behavior normal.   ED Results / Procedures / Treatments   Labs (all labs ordered are listed, but only abnormal results are displayed) Labs Reviewed  COMPREHENSIVE METABOLIC PANEL - Abnormal; Notable for the following components:      Result Value   Potassium 5.2 (*)    Glucose, Bld 108 (*)    Creatinine, Ser 1.11 (*)    GFR, Estimated 51 (*)    All other components within normal limits  CBC - Abnormal; Notable for the following components:   HCT 46.3 (*)    All other components within normal limits  URINALYSIS, ROUTINE W REFLEX MICROSCOPIC - Abnormal; Notable for the following components:   APPearance CLOUDY (*)    Hgb urine dipstick SMALL (*)    Leukocytes,Ua LARGE (*)    All other components within normal limits  URINALYSIS, MICROSCOPIC (REFLEX) - Abnormal; Notable for the following components:   Bacteria, UA MANY (*)    Non Squamous Epithelial PRESENT (*)    All other components within normal limits  GASTROINTESTINAL PANEL BY PCR, STOOL (REPLACES STOOL CULTURE)  C DIFFICILE QUICK SCREEN W PCR REFLEX    URINE CULTURE  LIPASE, BLOOD   EKG None  Radiology  CT ABDOMEN PELVIS W CONTRAST  Result Date: 08/06/2021 CLINICAL DATA:  Diffuse abdominal pain and  intermittent hematochezia. EXAM: CT ABDOMEN AND PELVIS WITH CONTRAST TECHNIQUE: Multidetector CT imaging of the abdomen and pelvis was performed using the standard protocol following bolus administration of intravenous contrast. CONTRAST:  39m OMNIPAQUE IOHEXOL 350 MG/ML SOLN COMPARISON:  CT abdomen pelvis dated 08/17/2020. FINDINGS: Lower chest: Bilateral breast implants are noted. Mild left basilar atelectasis. Hepatobiliary: No focal liver abnormality is seen. No gallstones, gallbladder wall thickening, or biliary dilatation. Pancreas: Unremarkable. No pancreatic ductal dilatation or surrounding inflammatory changes. Spleen: Normal in size without focal abnormality. Adrenals/Urinary Tract: Adrenal glands are unremarkable. There is severe right hydroureteronephrosis which appears similar to 08/17/2020 and extends to the proximal third of the right ureter (series 5, image 61). There is mild urothelial wall thickening at this area, however no discrete mass is identified. There is a moderate amount of gas noted within the collecting system on the right and a single locule of gas seen in the collecting system on the left (series 2, image 23). No evidence of gas within the renal parenchyma on either side. Nonobstructive renal calculi measure 8 mm on the left and 7 mm on the right. There is no left renal mass and no hydronephrosis on the left. Gas is seen in the urinary bladder. Stomach/Bowel: There is a small hiatal hernia with surgical clips near the gastroesophageal junction, unchanged. Appendix appears normal. There is mild segmental bowel wall thickening extending from the transverse colon to the sigmoid colon, most significant in the transverse colon and splenic flexure where there is also mucosal hyperenhancement. No significant surrounding fat stranding/inflammatory changes are noted. There are scattered colonic diverticula, however these are not significantly present in the majority of the bowel demonstrating  wall thickening. No evidence of bowel obstruction. Vascular/Lymphatic: Aortic atherosclerosis. No enlarged abdominal or pelvic lymph nodes. Reproductive: Status post hysterectomy. No adnexal masses. Other: No abdominal wall hernia or abnormality. No abdominopelvic ascites. Musculoskeletal: Degenerative changes are seen in the spine. IMPRESSION: 1. Mild bowel wall thickening of the colon is nonspecific and likely represents an infectious/inflammatory colitis. 2. Severe right hydroureteronephrosis with a stricture and urothelial wall thickening in the proximal third of the right ureter, similar to 08/17/2020. 3. Moderate volume gas in the right ureter, small volume gas in the bladder, and a single locule of gas in the left ureter are new since 08/17/2020. Electronically Signed   By: TZerita BoersM.D.   On: 08/06/2021 13:30    Procedures Procedures   Medications Ordered in ED Medications  fosfomycin (MONUROL) packet 3 g (has no administration in time range)  lactated ringers bolus 1,000 mL (0 mLs Intravenous Stopped 08/06/21 1417)  ondansetron (ZOFRAN) injection 4 mg (4 mg Intravenous Given 08/06/21 1153)  iohexol (OMNIPAQUE) 350 MG/ML injection 80 mL (80 mLs Intravenous Contrast Given 08/06/21 1256)  lactated ringers bolus 500 mL (500 mLs Intravenous New Bag/Given 08/06/21 1550)  cephALEXin (KEFLEX) capsule 250 mg (250 mg Oral Given 08/06/21 1417)   ED Course  I have reviewed the triage vital signs and the nursing notes.  Pertinent labs & imaging results that were available during my care of the patient were reviewed by me and considered in my medical decision making (see chart for details).  Clinical Course as of 08/06/21 1453  Sun Aug 06, 2021  1202 Patient became lightheaded while walking to the bathroom.  Will obtain orthostatic vital signs.  Nursing staff notes bloody stools. [LJ]  Clinical Course User Index [LJ] Rayna Sexton, PA-C   MDM Rules/Calculators/A&P                           Pt is a 80 y.o. female who presents to the emergency department due to abdominal pain, nausea, vomiting, diarrhea, rectal bleeding.  Labs: CBC with hematocrit of 46.3. CMP with a potassium of 5.2, glucose of 108, creatinine of 1.11, GFR 51. Lipase of 29. UA with small hemoglobin, large leukocytes, many bacteria, 9 squamous epithelial cells. Urine culture pending.  Imaging: CT scan obtained of the abdomen/pelvis with contrast showing mild bowel wall thickening of the colon which is nonspecific and likely result represents an infectious/inflammatory colitis.  There is severe right hydroureteronephrosis with a stricture and urothelial wall thickening in the proximal third of the right ureter, similar to prior imaging on August 17, 2020.  There is moderate volume gas in the right ureter, small volume of gas in the bladder, and a single locule of gas in the left ureter which are new since August 17, 2020.  I, Rayna Sexton, PA-C, personally reviewed and evaluated these images and lab results as part of my medical decision-making.  Physical exam significant for diffuse abdominal pain.  Patient reporting generalized weakness as well as some continued mild nausea.  She was given 1.5 L of IV fluids as well as a dose of Zofran.  She states that she has had some moderate improvement in her symptoms.  Hemoglobin stable at 14.5.  Mild elevation in hematocrit which is likely hemoconcentration.  Potassium mildly elevated at 5.2.  Mild elevation in creatinine of 1.11 with a GFR of 51.  Likely prerenal and due to dehydration.  UA concerning for infection.  Patient has a history of chronic UTIs and states that she is actually following up with a new urologist in 10 days regarding this.  We will give a dose of fosfomycin here in the emergency department and discharge on a course of Keflex.  Patient discussed with and evaluated by my attending physician Dr. Daleen Bo.  Patient is a Equities trader.  She was  offered admission but declined.  We both feel that this is reasonable as well.  She has not had a colonoscopy for quite a while and states that she has had 2 negative Cologuard's.  Recommend that she follow-up with her urologist at her scheduled appointment in 10 days and also reach out to her gastroenterologist regarding her symptoms and schedule an appointment for reevaluation.  Patient given very strict return precautions and she knows to return to the emergency department with any new or worsening symptoms.  Her questions were answered and she was amicable at the time of discharge.  Note: Portions of this report may have been transcribed using voice recognition software. Every effort was made to ensure accuracy; however, inadvertent computerized transcription errors may be present.   Final Clinical Impression(s) / ED Diagnoses Final diagnoses:  Generalized abdominal pain  Rectal bleeding   Rx / DC Orders ED Discharge Orders          Ordered    cephALEXin (KEFLEX) 500 MG capsule  4 times daily        08/06/21 1450             Rayna Sexton, PA-C 08/06/21 1500    Daleen Bo, MD 08/07/21 1029

## 2021-08-08 LAB — URINE CULTURE: Culture: >=100000 — AB

## 2021-08-08 LAB — CULTURE, GROUP A STREP (THRC)

## 2021-08-10 LAB — URINE CULTURE: Culture: >=100000 — AB

## 2021-08-11 ENCOUNTER — Telehealth (HOSPITAL_BASED_OUTPATIENT_CLINIC_OR_DEPARTMENT_OTHER): Payer: Self-pay | Admitting: Emergency Medicine

## 2021-08-11 NOTE — Telephone Encounter (Signed)
Post ED Visit - Positive Culture Follow-up  Culture report reviewed by antimicrobial stewardship pharmacist: [facility] Team '[]'$  [address], Pharm.D. '[]'$  Heide Guile, Pharm.D., BCPS AQ-ID '[]'$  Parks Neptune, Pharm.D., BCPS '[]'$  Alycia Rossetti, Pharm.D., BCPS '[]'$  [place], Pharm.D., BCPS, AAHIVP '[]'$  Legrand Como, Pharm.D., BCPS, AAHIVP '[]'$  Salome Arnt, PharmD, BCPS '[]'$  Johnnette Gourd, PharmD, BCPS '[]'$  Hughes Better, PharmD, BCPS '[]'$  Leeroy Cha, PharmD '[]'$  Laqueta Linden, PharmD, BCPS '[]'$  Albertina Parr, PharmD  [place] Team '[]'$  Leodis Sias, PharmD '[]'$  Lindell Spar, PharmD '[]'$  Royetta Asal, PharmD '[]'$  Graylin Shiver, Rph '[]'$  Rema Fendt) Glennon Mac, PharmD '[]'$  Arlyn Dunning, PharmD '[]'$  Netta Cedars, PharmD '[x]'$  Vicente Masson, PharmD '[]'$  Leone Haven, PharmD '[]'$  Gretta Arab, PharmD '[]'$  Theodis Shove, PharmD '[]'$  Peggyann Juba, PharmD '[]'$  Reuel Boom, PharmD   Positive urine culture Treated with Cephalexin, organism sensitive to the same and no further patient follow-up is required at this time.  Alison Mcguire 08/11/2021, 10:22 AM

## 2021-08-18 ENCOUNTER — Ambulatory Visit
Admission: EM | Admit: 2021-08-18 | Discharge: 2021-08-18 | Disposition: A | Discharge status: Home / Self Care | Payer: Medicare Other | Attending: Internal Medicine | Admitting: Internal Medicine

## 2021-08-18 ENCOUNTER — Other Ambulatory Visit: Payer: Self-pay

## 2021-08-18 ENCOUNTER — Encounter: Payer: Self-pay | Admitting: Emergency Medicine

## 2021-08-18 DIAGNOSIS — R197 Diarrhea, unspecified: Secondary | ICD-10-CM | POA: Diagnosis not present

## 2021-08-18 DIAGNOSIS — T7840XA Allergy, unspecified, initial encounter: Secondary | ICD-10-CM

## 2021-08-18 DIAGNOSIS — L500 Allergic urticaria: Secondary | ICD-10-CM | POA: Diagnosis not present

## 2021-08-18 MED ORDER — METHYLPREDNISOLONE SODIUM SUCC 125 MG IJ SOLR
60.0000 mg | Freq: Once | INTRAMUSCULAR | Status: AC
  Administered 2021-08-18: 60 mg via INTRAMUSCULAR

## 2021-08-18 NOTE — ED Triage Notes (Signed)
Patient c/o possible allergic reaction to a new antibiotic started on Tuesday.  Patient was started on Trimethoprim.  Body turned red in color, slight throat discomfort.  Patient denies taken any medication.

## 2021-08-18 NOTE — ED Provider Notes (Signed)
Alison Mcguire    CSN: [id number] Arrival date & time: 08/18/21  1618      History   Chief Complaint Chief Complaint  Patient presents with   Allergic Reaction    HPI Alison Mcguire is a 80 y.o. female.   Patient presents with possible allergic reaction that started a few hours prior to arrival.  Patient states she "felt hot" and then started having itchiness that was present to bilateral chest, arms, face with associated erythema. Denies any shortness of breath but states that her "breathing feels slightly different." Denies any chest pain.  States that she started a new antibiotic about 3 days ago that was prescribed by urologist named trimethoprim daily for prevention of UTI's and thinks that these symptoms are related to this. Also having diarrhea that started today and has had 7 episodes. Denies any rectal bleeding or blood in stool. Was seen at [facility] on 8/13 and 8/14 for GI symptoms. 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 Was see by GI specialist on 8/17 for this and was advised to continue zofran and was scheduled for colonoscopy in 1 month. GI note states that symptoms should resolve on their own with rest, hydration, and zofran as needed. Patient states that this diarrhea "is different" than diarrhea that was present a few weeks prior. Patient states that diarrhea had stopped and this diarrhea started today.    Allergic Reaction  Past Medical History:  Diagnosis Date   Anxiety    Depression    Fibrocystic breast    GERD (gastroesophageal reflux disease)    History of anemia    History of lower GI bleeding 05/2009   s/p colonoscopy w/ endoclip of bleed   History of traumatic injury of head    per pt at age 18 or 55 fell from tree , skull fracture with loc, no surgery, not in coma,  recovered with residual migraines   Hydronephrosis, right    Hyperlipidemia    Hypertension    OA (osteoarthritis)    Seasonal allergies     SUI (stress urinary incontinence, female)    Vestibular migraine    hx complicated migraine    Patient Active Problem List   Diagnosis Date Noted   Complicated migraine [id number]   Overactive bladder 08/31/2017   Hair loss 08/31/2017   Transient vision disturbance    Colitis 07/28/2012   Bloody diarrhea 07/28/2012   Abdominal pain 07/28/2012   GERD (gastroesophageal reflux disease) 07/28/2012    Past Surgical History:  Procedure Laterality Date   BREAST IMPLANT EXCHANGE Bilateral x3 one side and x1 on the other side ,  last surgery approx. 1990s   COLONOSCOPY     CYSTOSCOPY WITH RETROGRADE PYELOGRAM, URETEROSCOPY AND STENT PLACEMENT Right 09/08/2020   Procedure: CYSTOSCOPY WITH RETROGRADE PYELOGRAM, URETEROSCOPY, DILATION OF STRICTURE  AND DOUBLE J STENT PLACEMENT;  Surgeon: Franchot Gallo, MD;  Location: [facility];  Service: Urology;  Laterality: Right;   EXPLORATORY THORACOTOMY/ RESECTION ESOPHAGEAL DIVERTICULUM/ ESOPHAGEAL MYOTOMY/  BELSEY MARK IV FUNDOPLICATION Left [id number]   DR Arlyce Dice '@[facility]    SIMPLE MASTECTOMY Bilateral 1980s   per pt done for atypical cells, had fibrocystic breast   TONSILLECTOMY AND ADENOIDECTOMY  1970s   VAGINAL HYSTERECTOMY  1970s    OB History   No obstetric history on file.      Home Medications    Prior to Admission medications   Medication Sig Start Date End Date Taking?  Authorizing Provider  acetaminophen (TYLENOL) 500 MG tablet Take 500 mg by mouth every 8 (eight) hours as needed.   Yes [provider]  Biotin 5000 MCG CAPS Take 1 capsule by mouth at bedtime.   Yes [provider]  Calcium Carb-Cholecalciferol (CALCIUM 600+D3 PO) Take 1 tablet by mouth in the morning and at bedtime.   Yes [provider]  calcium carbonate (OSCAL) 1500 (600 Ca) MG TABS tablet Take by mouth 2 (two) times daily with a meal.   Yes [provider]  Cranberry-Vitamin C 250-60 MG CAPS Take by mouth.    Yes [provider]  Ginkgo Biloba 40 MG TABS Take by mouth daily.   Yes [provider]  GLUCOS-CHONDROIT-HYALURON-MSM PO Take 1-2 tablets by mouth in the morning and at bedtime.    Yes [provider]  Glycerin-Hypromellose-PEG 400 (DRY EYE RELIEF DROPS OP) Apply to eye as needed.   Yes [provider]  loratadine (CLARITIN) 10 MG tablet Take 10 mg by mouth daily.   Yes [provider]  Multiple Vitamins-Minerals (MULTIVITAMIN WITH MINERALS) tablet Take 1 tablet by mouth daily.   Yes [provider]  multivitamin-lutein (OCUVITE-LUTEIN) CAPS capsule Take 1 capsule by mouth daily.   Yes [provider]  olopatadine (PATANOL) 0.1 % ophthalmic solution 1 drop 2 (two) times daily as needed for allergies.   Yes [provider]  Omega-3 Fatty Acids (FISH OIL PO) Take by mouth daily.   Yes [provider]  omeprazole (PRILOSEC) 20 MG capsule Take 20 mg by mouth daily.   Yes [provider]  ondansetron (ZOFRAN) 4 MG tablet Take 1 tablet (4 mg total) by mouth every 6 (six) hours as needed for nausea or vomiting. 08/05/21  Yes Odis Luster, FNP  pravastatin (PRAVACHOL) 20 MG tablet Take 20 mg by mouth at bedtime.  12/22/19  Yes [provider]  triamcinolone cream (KENALOG) 0.1 % Apply 1 application topically 2 (two) times daily. 04/17/21  Yes Wieters, Hallie C, PA-C  Vitamin D, Ergocalciferol, (DRISDOL) 50000 UNITS CAPS Take 50,000 Units by mouth every 7 (seven) days. Every Wednesday   Yes [provider]  Zinc 50 MG CAPS Take 1 capsule by mouth at bedtime.    Yes [provider]  ALPRAZolam Duanne Moron) 0.5 MG tablet Take 1 mg by mouth at bedtime.     [provider]  meloxicam (MOBIC) 15 MG tablet Take 15 mg by mouth daily.    [provider]  metoprolol succinate (TOPROL-XL) 50 MG 24 hr tablet Take 50 mg by mouth at bedtime.  12/08/19   [provider]  Multiple  Vitamins-Minerals (HAIR SKIN NAILS PO) Take by mouth daily.    [provider]  tolterodine (DETROL LA) 4 MG 24 hr capsule Take 4 mg by mouth daily.  11/29/19   [provider]    Family History Family History  Problem Relation Age of Onset   Hypertension Sister    Diabetes Son    Stroke Sister    Diabetes Sister     Social History Social History   Tobacco Use   Smoking status: Never   Smokeless tobacco: Never  Vaping Use   Vaping Use: Never used  Substance Use Topics   Alcohol use: No   Drug use: Never     Allergies   Codeine, Ambien [zolpidem], Aspirin, Botox [botulinum toxin type a], Clinoril [sulindac], Demerol [meperidine], Dilaudid [hydromorphone hcl], Ether, Flagyl [metronidazole], Penicillins, Soma [carisoprodol], Topamax [  topiramate], Adhesive [tape], Azithromycin, Betadine [povidone iodine], and Nickel   Review of Systems Review of Systems Per HPI  Physical Exam Triage Vital Signs ED Triage Vitals  Enc Vitals Group     BP 08/18/21 1629 139/71     Pulse Rate 08/18/21 1629 98     Resp 08/18/21 1629 18     Temp 08/18/21 1629 98.2 F (36.8 C)     Temp Source 08/18/21 1629 Oral     SpO2 08/18/21 1629 97 %     Weight 08/18/21 1631 137 lb (62.1 kg)     Height 08/18/21 1631 '5\' 4"'$  (1.626 m)     Head Circumference --      Peak Flow --      Pain Score 08/18/21 1631 2     Pain Loc --      Pain Edu? --      Excl. in [place]? --    No data found.  Updated Vital Signs BP 139/71 (BP Location: Left Arm)   Pulse 98   Temp 98.2 F (36.8 C) (Oral)   Resp 18   Ht '5\' 4"'$  (1.626 m)   Wt 137 lb (62.1 kg)   LMP 07/28/2012   SpO2 97%   BMI 23.52 kg/m   Visual Acuity Right Eye Distance:   Left Eye Distance:   Bilateral Distance:    Right Eye Near:   Left Eye Near:    Bilateral Near:     Physical Exam Constitutional:      Appearance: Normal appearance.  HENT:     Head: Normocephalic and atraumatic.  Eyes:     Extraocular Movements:  Extraocular movements intact.     Conjunctiva/sclera: Conjunctivae normal.  Cardiovascular:     Rate and Rhythm: Normal rate and regular rhythm.     Pulses: Normal pulses.     Heart sounds: Normal heart sounds.  Pulmonary:     Effort: Pulmonary effort is normal. No respiratory distress.     Breath sounds: Normal breath sounds. No stridor. No wheezing or rhonchi.  Skin:    General: Skin is warm and dry.     Findings: Rash present. Rash is urticarial.     Comments: Mild erythema noted to bilateral arms, bilateral cheeks of face, and upper chest.   Neurological:     General: No focal deficit present.     Mental Status: She is alert and oriented to person, place, and time. Mental status is at baseline.  Psychiatric:        Mood and Affect: Mood normal.        Behavior: Behavior normal.        Thought Content: Thought content normal.        Judgment: Judgment normal.     UC Treatments / Results  Labs (all labs ordered are listed, but only abnormal results are displayed) Labs Reviewed - No data to display  EKG   Radiology No results found.  Procedures Procedures (including critical [facility] time)  Medications Ordered in UC Medications  methylPREDNISolone sodium succinate (SOLU-MEDROL) 125 mg/2 mL injection 60 mg (60 mg Intramuscular Given 08/18/21 1653)    Initial Impression / Assessment and Plan / UC Course  I have reviewed the triage vital signs and the nursing notes.  Pertinent labs & imaging results that were available during my [facility] of the patient were reviewed by me and considered in my medical decision making (see chart for details).     Limited options on treatment for allergic reaction  as patient is having several GI issues at this time. Will treat with Solu-Medrol IM injection to help alleviate allergic action today.  Continue cetirizine antihistamine as well.  Patient to stop trimethroprim and follow up with urology to change daily antibiotic.Advised patient to go to  the hospital if allergic reaction worsens.  Unable to determine if diarrhea is associated with allergic reaction/medication use or previous documented ischemic colitis.  Advised patient that it would be best for her to go to the hospital for further evaluation of diarrhea but patient declined.  Risks of not going to the hospital were discussed with patient. Patient voiced understanding. Advised patient that she needs to follow-up with GI specialist as soon as possible for further evaluation management of diarrhea. Patient was stable at discharge.  Discussed strict return precautions. Patient verbalized understanding and is agreeable with plan.  Final Clinical Impressions(s) / UC Diagnoses   Final diagnoses:  Allergic reaction, initial encounter  Allergic urticaria  Diarrhea, unspecified type     Discharge Instructions      You were given steroid injection to help with allergic reaction.  Please continue taking antihistamine as well.  Go the hospital if reaction worsens.  Please follow-up with GI for diarrhea.     ED Prescriptions   None    PDMP not reviewed this encounter.   Odis Luster, [place] 08/18/21 [phone]

## 2021-08-18 NOTE — Discharge Instructions (Addendum)
You were given steroid injection to help with allergic reaction.  Please continue taking antihistamine as well.  Go the hospital if reaction worsens.  Please follow-up with GI for diarrhea.

## 2021-11-07 ENCOUNTER — Emergency Department (HOSPITAL_COMMUNITY)
Admission: EM | Admit: 2021-11-07 | Discharge: 2021-11-07 | Disposition: A | Discharge status: Home / Self Care | Payer: Medicare Other | Attending: Emergency Medicine | Admitting: Emergency Medicine

## 2021-11-07 ENCOUNTER — Encounter (HOSPITAL_COMMUNITY): Payer: Self-pay

## 2021-11-07 ENCOUNTER — Emergency Department (HOSPITAL_COMMUNITY): Payer: Medicare Other

## 2021-11-07 ENCOUNTER — Other Ambulatory Visit: Payer: Self-pay

## 2021-11-07 DIAGNOSIS — K5730 Diverticulosis of large intestine without perforation or abscess without bleeding: Secondary | ICD-10-CM | POA: Diagnosis not present

## 2021-11-07 DIAGNOSIS — K449 Diaphragmatic hernia without obstruction or gangrene: Secondary | ICD-10-CM | POA: Insufficient documentation

## 2021-11-07 DIAGNOSIS — N390 Urinary tract infection, site not specified: Secondary | ICD-10-CM

## 2021-11-07 DIAGNOSIS — Z5321 Procedure and treatment not carried out due to patient leaving prior to being seen by health care provider: Secondary | ICD-10-CM | POA: Diagnosis not present

## 2021-11-07 DIAGNOSIS — N200 Calculus of kidney: Secondary | ICD-10-CM | POA: Insufficient documentation

## 2021-11-07 DIAGNOSIS — N1330 Unspecified hydronephrosis: Secondary | ICD-10-CM | POA: Diagnosis not present

## 2021-11-07 DIAGNOSIS — R638 Other symptoms and signs concerning food and fluid intake: Secondary | ICD-10-CM | POA: Diagnosis present

## 2021-11-07 DIAGNOSIS — R319 Hematuria, unspecified: Secondary | ICD-10-CM

## 2021-11-07 LAB — CBC WITH DIFFERENTIAL/PLATELET
Abs Immature Granulocytes: 0.01 10*3/uL (ref 0.00–0.07)
Basophils Absolute: 0 10*3/uL (ref 0.0–0.1)
Basophils Relative: 0 %
Eosinophils Absolute: 0 10*3/uL (ref 0.0–0.5)
Eosinophils Relative: 1 %
HCT: 37.4 % (ref 36.0–46.0)
Hemoglobin: 12.3 g/dL (ref 12.0–15.0)
Immature Granulocytes: 0 %
Lymphocytes Relative: 21 %
Lymphs Abs: 1 10*3/uL (ref 0.7–4.0)
MCH: 30.4 pg (ref 26.0–34.0)
MCHC: 32.9 g/dL (ref 30.0–36.0)
MCV: 92.3 fL (ref 80.0–100.0)
Monocytes Absolute: 0.3 10*3/uL (ref 0.1–1.0)
Monocytes Relative: 5 %
Neutro Abs: 3.5 10*3/uL (ref 1.7–7.7)
Neutrophils Relative %: 73 %
Platelets: 205 10*3/uL (ref 150–400)
RBC: 4.05 MIL/uL (ref 3.87–5.11)
RDW: 13.3 % (ref 11.5–15.5)
WBC: 4.9 10*3/uL (ref 4.0–10.5)
nRBC: 0 % (ref 0.0–0.2)

## 2021-11-07 LAB — BASIC METABOLIC PANEL
Anion gap: 7 (ref 5–15)
BUN: 20 mg/dL (ref 8–23)
CO2: 28 mmol/L (ref 22–32)
Calcium: 8.9 mg/dL (ref 8.9–10.3)
Chloride: 106 mmol/L (ref 98–111)
Creatinine, Ser: 0.79 mg/dL (ref 0.44–1.00)
GFR, Estimated: >60 mL/min (ref >60)
Glucose, Bld: 113 mg/dL — ABNORMAL HIGH (ref 70–99)
Potassium: 4.3 mmol/L (ref 3.5–5.1)
Sodium: 141 mmol/L (ref 135–145)

## 2021-11-07 LAB — URINALYSIS, ROUTINE W REFLEX MICROSCOPIC
Bilirubin Urine: NEGATIVE
Glucose, UA: NEGATIVE mg/dL
Ketones, ur: 20 mg/dL — AB
Nitrite: NEGATIVE
Protein, ur: 30 mg/dL — AB
Specific Gravity, Urine: 1.006 (ref 1.005–1.030)
WBC, UA: >50 WBC/hpf — ABNORMAL HIGH (ref 0–5)
pH: 5 (ref 5.0–8.0)

## 2021-11-07 MED ORDER — LORAZEPAM 2 MG/ML IJ SOLN
0.5000 mg | Freq: Once | INTRAMUSCULAR | Status: AC
  Administered 2021-11-07: 0.5 mg via INTRAVENOUS
  Filled 2021-11-07: qty 1

## 2021-11-07 MED ORDER — PANTOPRAZOLE SODIUM 20 MG PO TBEC
20.0000 mg | DELAYED_RELEASE_TABLET | Freq: Once | ORAL | Status: AC
  Administered 2021-11-07: 20 mg via ORAL
  Filled 2021-11-07: qty 1

## 2021-11-07 MED ORDER — NITROFURANTOIN MONOHYD MACRO 100 MG PO CAPS: 100.0000 mg | ORAL_CAPSULE | Freq: Two times a day (BID) | ORAL | 0 refills | Status: AC

## 2021-11-07 MED ORDER — LACTATED RINGERS IV BOLUS
1000.0000 mL | Freq: Once | INTRAVENOUS | Status: AC
  Administered 2021-11-07: 1000 mL via INTRAVENOUS

## 2021-11-07 MED ORDER — NITROFURANTOIN MONOHYD MACRO 100 MG PO CAPS
100.0000 mg | ORAL_CAPSULE | Freq: Once | ORAL | Status: AC
  Administered 2021-11-07: 100 mg via ORAL
  Filled 2021-11-07: qty 1

## 2021-11-07 MED ORDER — LACTATED RINGERS IV SOLN: INTRAVENOUS | Status: DC

## 2021-11-07 NOTE — Discharge Instructions (Addendum)
Stop the flomax and ditropan as directed by Dr Amalia Hailey.  Take the antibiotics as prescribed. Follow up with your urologist tomorrow as planned

## 2021-11-07 NOTE — ED Provider Notes (Signed)
[place] DEPT Provider Note   CSN: [id number] Arrival date & time: 11/07/21  1158     History No chief complaint on file.   Alison Mcguire is a 80 y.o. female.  80 year old female presents with increased weakness that gets worse when he stands up.  Has noted increased heart rate when this occurs.  States she is had decreased oral intake.  Denies any fever, emesis.  Did have recent ureteral stent surgery states that she has had no change in her urine denies any new significant back pain.  EMS was called and patient given half liter of saline and Zofran and does feel slightly better.  Notes decreased appetite.      Past Medical History:  Diagnosis Date   Anxiety    Depression    Fibrocystic breast    GERD (gastroesophageal reflux disease)    History of anemia    History of lower GI bleeding 05/2009   s/p colonoscopy w/ endoclip of bleed   History of traumatic injury of head    per pt at age 89 or 89 fell from tree , skull fracture with loc, no surgery, not in coma,  recovered with residual migraines   Hydronephrosis, right    Hyperlipidemia    Hypertension    OA (osteoarthritis)    Seasonal allergies    SUI (stress urinary incontinence, female)    Vestibular migraine    hx complicated migraine    Patient Active Problem List   Diagnosis Date Noted   Complicated migraine 67/11/4579   Overactive bladder 08/31/2017   Hair loss 08/31/2017   Transient vision disturbance    Colitis 07/28/2012   Bloody diarrhea 07/28/2012   Abdominal pain 07/28/2012   GERD (gastroesophageal reflux disease) 07/28/2012    Past Surgical History:  Procedure Laterality Date   BREAST IMPLANT EXCHANGE Bilateral x3 one side and x1 on the other side ,  last surgery approx. 1990s   COLONOSCOPY     CYSTOSCOPY WITH RETROGRADE PYELOGRAM, URETEROSCOPY AND STENT PLACEMENT Right 09/08/2020   Procedure: CYSTOSCOPY WITH RETROGRADE PYELOGRAM, URETEROSCOPY, DILATION OF  STRICTURE  AND DOUBLE J STENT PLACEMENT;  Surgeon: Franchot Gallo, MD;  Location: [facility];  Service: Urology;  Laterality: Right;   EXPLORATORY THORACOTOMY/ RESECTION ESOPHAGEAL DIVERTICULUM/ ESOPHAGEAL MYOTOMY/  BELSEY MARK IV FUNDOPLICATION Left [id number]   DR Arlyce Dice @[facility]    SIMPLE MASTECTOMY Bilateral 1980s   per pt done for atypical cells, had fibrocystic breast   TONSILLECTOMY AND ADENOIDECTOMY  1970s   VAGINAL HYSTERECTOMY  1970s     OB History   No obstetric history on file.     Family History  Problem Relation Age of Onset   Hypertension Sister    Diabetes Son    Stroke Sister    Diabetes Sister     Social History   Tobacco Use   Smoking status: Never   Smokeless tobacco: Never  Vaping Use   Vaping Use: Never used  Substance Use Topics   Alcohol use: No   Drug use: Never    Home Medications Prior to Admission medications   Medication Sig Start Date End Date Taking? Authorizing Provider  acetaminophen (TYLENOL) 500 MG tablet Take 500 mg by mouth every 8 (eight) hours as needed.    [provider]  ALPRAZolam Duanne Moron) 0.5 MG tablet Take 1 mg by mouth at bedtime.     [provider]  Biotin 5000 MCG CAPS Take 1 capsule by mouth at bedtime.  [provider]  Calcium Carb-Cholecalciferol (CALCIUM 600+D3 PO) Take 1 tablet by mouth in the morning and at bedtime.    [provider]  calcium carbonate (OSCAL) 1500 (600 Ca) MG TABS tablet Take by mouth 2 (two) times daily with a meal.    [provider]  Cranberry-Vitamin C 250-60 MG CAPS Take by mouth.    [provider]  Ginkgo Biloba 40 MG TABS Take by mouth daily.    [provider]  GLUCOS-CHONDROIT-HYALURON-MSM PO Take 1-2 tablets by mouth in the morning and at bedtime.     [provider]  Glycerin-Hypromellose-PEG 400 (DRY EYE RELIEF DROPS OP) Apply to eye as needed.    [provider]  loratadine (CLARITIN) 10 MG  tablet Take 10 mg by mouth daily.    [provider]  meloxicam (MOBIC) 15 MG tablet Take 15 mg by mouth daily.    [provider]  metoprolol succinate (TOPROL-XL) 50 MG 24 hr tablet Take 50 mg by mouth at bedtime.  12/08/19   [provider]  Multiple Vitamins-Minerals (HAIR SKIN NAILS PO) Take by mouth daily.    [provider]  Multiple Vitamins-Minerals (MULTIVITAMIN WITH MINERALS) tablet Take 1 tablet by mouth daily.    [provider]  multivitamin-lutein (OCUVITE-LUTEIN) CAPS capsule Take 1 capsule by mouth daily.    [provider]  olopatadine (PATANOL) 0.1 % ophthalmic solution 1 drop 2 (two) times daily as needed for allergies.    [provider]  Omega-3 Fatty Acids (FISH OIL PO) Take by mouth daily.    [provider]  omeprazole (PRILOSEC) 20 MG capsule Take 20 mg by mouth daily.    [provider]  ondansetron (ZOFRAN) 4 MG tablet Take 1 tablet (4 mg total) by mouth every 6 (six) hours as needed for nausea or vomiting. 08/05/21   Teodora Medici, FNP  pravastatin (PRAVACHOL) 20 MG tablet Take 20 mg by mouth at bedtime.  12/22/19   [provider]  tolterodine (DETROL LA) 4 MG 24 hr capsule Take 4 mg by mouth daily.  11/29/19   [provider]  triamcinolone cream (KENALOG) 0.1 % Apply 1 application topically 2 (two) times daily. 04/17/21   Wieters, Hallie C, PA-C  Vitamin D, Ergocalciferol, (DRISDOL) 50000 UNITS CAPS Take 50,000 Units by mouth every 7 (seven) days. Every Wednesday    [provider]  Zinc 50 MG CAPS Take 1 capsule by mouth at bedtime.     [provider]    Allergies    Codeine, Ambien [zolpidem], Aspirin, Botox [botulinum toxin type a], Clinoril [sulindac], Demerol [meperidine], Dilaudid [hydromorphone hcl], Ether, Flagyl [metronidazole], Penicillins, Soma [carisoprodol], Topamax [topiramate], Adhesive [tape], Azithromycin, Betadine [povidone iodine],  and Nickel  Review of Systems   Review of Systems  All other systems reviewed and are negative.  Physical Exam Updated Vital Signs LMP 07/28/2012   SpO2 98%   Physical Exam Vitals and nursing note reviewed.  Constitutional:      General: She is not in acute distress.    Appearance: Normal appearance. She is well-developed. She is not toxic-appearing.  HENT:     Head: Normocephalic and atraumatic.  Eyes:     General: Lids are normal.     Conjunctiva/sclera: Conjunctivae normal.     Pupils: Pupils are equal, round, and reactive to light.  Neck:     Thyroid: No thyroid mass.     Trachea: No tracheal deviation.  Cardiovascular:  Rate and Rhythm: Normal rate and regular rhythm.     Heart sounds: Normal heart sounds. No murmur heard.   No gallop.  Pulmonary:     Effort: Pulmonary effort is normal. No respiratory distress.     Breath sounds: Normal breath sounds. No stridor. No decreased breath sounds, wheezing, rhonchi or rales.  Abdominal:     General: There is no distension.     Palpations: Abdomen is soft.     Tenderness: There is no abdominal tenderness. There is no rebound.  Musculoskeletal:        General: No tenderness. Normal range of motion.     Cervical back: Normal range of motion and neck supple.  Skin:    General: Skin is warm and dry.     Findings: No abrasion or rash.  Neurological:     Mental Status: She is alert and oriented to person, place, and time. Mental status is at baseline.     GCS: GCS eye subscore is 4. GCS verbal subscore is 5. GCS motor subscore is 6.     Cranial Nerves: No cranial nerve deficit.     Sensory: No sensory deficit.     Motor: Motor function is intact.  Psychiatric:        Attention and Perception: Attention normal.        Speech: Speech normal.        Behavior: Behavior normal.    ED Results / Procedures / Treatments   Labs (all labs ordered are listed, but only abnormal results are displayed) Labs Reviewed  CBC WITH  DIFFERENTIAL/PLATELET  BASIC METABOLIC PANEL    EKG None  Radiology No results found.  Procedures Procedures   Medications Ordered in ED Medications  lactated ringers bolus 1,000 mL (has no administration in time range)  lactated ringers infusion (has no administration in time range)    ED Course  I have reviewed the triage vital signs and the nursing notes.  Pertinent labs & imaging results that were available during my care of the patient were reviewed by me and considered in my medical decision making (see chart for details).    MDM Rules/Calculators/A&P                           Patient having IV fluids and feels better here.  Patient work-up pending and signout to Dr. Tomi Bamberger Final Clinical Impression(s) / ED Diagnoses Final diagnoses:  None    Rx / DC Orders ED Discharge Orders     None        Lacretia Leigh, MD 11/15/21 1732

## 2021-11-07 NOTE — ED Triage Notes (Signed)
Patient presented to the Ed with c/o dizziness when standing.Patient c/o generalized weakness and been feeling this way since 11/03/21 had stent place. Patient given Zofran 4 mg and  NS 500 ml per EMS.

## 2021-11-07 NOTE — ED Provider Notes (Signed)
Patient was initially seen by Dr. Zenia Resides.  Please see his note.  Patient presented to the ED after an episode of weakness and decreased oral intake.  Initial ED work-up was notable for normal CBC and metabolic panel.  Patient did have evidence of a urinary tract infection.  Patient had recent ureteral stenting.  Patient CT scan was performed and she has mild to moderate right-sided hydronephrosis and hydroureter with properly positioned ureteral stent.  D/w  [facility] [place].   [place] for outpatient management.  Her appointment is still listed as active.  Pt can still follow up with that appointment.   Dorie Rank, MD 11/07/21 Dorthula Perfect

## 2022-02-27 ENCOUNTER — Other Ambulatory Visit: Payer: Self-pay | Admitting: Family Medicine

## 2022-02-27 DIAGNOSIS — Z1231 Encounter for screening mammogram for malignant neoplasm of breast: Secondary | ICD-10-CM

## 2022-03-01 ENCOUNTER — Other Ambulatory Visit: Payer: Self-pay

## 2022-03-01 ENCOUNTER — Encounter: Payer: Self-pay | Admitting: Podiatry

## 2022-03-01 ENCOUNTER — Ambulatory Visit: Payer: Medicare Other | Admitting: Podiatry

## 2022-03-01 ENCOUNTER — Ambulatory Visit (INDEPENDENT_AMBULATORY_CARE_PROVIDER_SITE_OTHER): Payer: Medicare Other

## 2022-03-01 DIAGNOSIS — M2041 Other hammer toe(s) (acquired), right foot: Secondary | ICD-10-CM

## 2022-03-01 DIAGNOSIS — L600 Ingrowing nail: Secondary | ICD-10-CM | POA: Diagnosis not present

## 2022-03-01 DIAGNOSIS — M2042 Other hammer toe(s) (acquired), left foot: Secondary | ICD-10-CM

## 2022-03-01 DIAGNOSIS — M21619 Bunion of unspecified foot: Secondary | ICD-10-CM

## 2022-03-01 NOTE — Patient Instructions (Signed)

## 2022-03-04 NOTE — Progress Notes (Signed)
Subjective:  ? ?Patient ID: Alison Mcguire, female   DOB: 81 y.o.   MRN: [id number]  ? ?HPI ?Patient presents with significant digital deformities digit 2 of both feet stating that they do not function properly and that they can make pain for her when she wears the wrong types of shoes.  States its been this way for a while seems to be slowly worse and patient does not smoke does try to be active ? ? ?Review of Systems  ?All other systems reviewed and are negative. ? ? ?   ?Objective:  ?Physical Exam ?Vitals and nursing note reviewed.  ?Constitutional:   ?   Appearance: She is well-developed.  ?Pulmonary:  ?   Effort: Pulmonary effort is normal.  ?Musculoskeletal:     ?   General: Normal range of motion.  ?Skin: ?   General: Skin is warm.  ?Neurological:  ?   Mental Status: She is alert.  ?  ?Neurovascular status intact muscle strength found to be adequate range of motion adequate with patient found to have structural deformity of the feet bilateral with elevated second digits bilateral with no indications of erythema edema surrounding the area and interdigital keratotic lesions which are mildly tender.  I did note that the lateral border of the right hallux is excepting a lot of the pressure from the second toe right and that is incurvated and becoming more tender for the patient ? ?   ?Assessment:  ?May be more of a ingrown toenail secondary to the structural changes along with hammertoe deformity and moderate bunion deformity ? ?   ?Plan:  ?H&P reviewed all conditions and do not recommend structural bone correction at the time but I am hoping if we can correct the ingrown toenail that we could reduce the pressure she is experiencing.  I explained procedure risk and after review she signed consent form.  I infiltrated the right hallux 60 mg like Marcaine mixture I remove the lateral border exposed matrix applied phenol 3 applications 30 seconds followed by alcohol lavage sterile dressing gave instructions on  soaks and wider shoe gear and patient will be seen back as needed and ultimately could require bony correction ? ?X-rays indicate structural bunion deformity bilateral with digital deformity with elevated second toe bilateral ?   ? ? ?

## 2023-01-21 ENCOUNTER — Other Ambulatory Visit: Payer: Self-pay | Admitting: Urology

## 2023-01-21 ENCOUNTER — Ambulatory Visit
Admission: RE | Admit: 2023-01-21 | Discharge: 2023-01-21 | Disposition: A | Discharge status: Home / Self Care | Payer: Medicare Other | Source: Ambulatory Visit | Attending: Urology | Admitting: Urology

## 2023-01-21 DIAGNOSIS — N200 Calculus of kidney: Secondary | ICD-10-CM

## 2024-11-09 ENCOUNTER — Other Ambulatory Visit: Payer: Self-pay | Admitting: Internal Medicine

## 2024-11-09 DIAGNOSIS — R634 Abnormal weight loss: Secondary | ICD-10-CM

## 2024-11-09 DIAGNOSIS — K219 Gastro-esophageal reflux disease without esophagitis: Secondary | ICD-10-CM

## 2024-11-09 DIAGNOSIS — R110 Nausea: Secondary | ICD-10-CM

## 2024-11-13 ENCOUNTER — Ambulatory Visit
Admission: RE | Admit: 2024-11-13 | Discharge: 2024-11-13 | Disposition: A | Discharge status: Home / Self Care | Source: Ambulatory Visit | Attending: Internal Medicine | Admitting: Internal Medicine

## 2024-11-13 DIAGNOSIS — R634 Abnormal weight loss: Secondary | ICD-10-CM

## 2024-11-13 DIAGNOSIS — R110 Nausea: Secondary | ICD-10-CM

## 2024-11-13 MED ORDER — IOPAMIDOL (ISOVUE-370) INJECTION 76%
75.0000 mL | Freq: Once | INTRAVENOUS | Status: AC | PRN
  Administered 2024-11-13: 75 mL via INTRAVENOUS

## 2025-01-14 ENCOUNTER — Ambulatory Visit
Admission: RE | Admit: 2025-01-14 | Discharge: 2025-01-14 | Disposition: A | Discharge status: Home / Self Care | Source: Ambulatory Visit | Attending: Internal Medicine | Admitting: Internal Medicine

## 2025-01-14 DIAGNOSIS — R110 Nausea: Secondary | ICD-10-CM

## 2025-01-14 DIAGNOSIS — K219 Gastro-esophageal reflux disease without esophagitis: Secondary | ICD-10-CM

## 2025-01-26 ENCOUNTER — Encounter: Payer: Self-pay | Admitting: Physician Assistant

## 2025-02-03 ENCOUNTER — Ambulatory Visit: Admitting: Physician Assistant

## 2025-02-19 ENCOUNTER — Ambulatory Visit: Admitting: Physician Assistant
